# Patient Record
Sex: Male | Born: 1960 | Race: White | Hispanic: No | Marital: Married | Smoking: Current every day smoker
Health system: Southern US, Community
[De-identification: ages and names within clinical notes are randomized; demographics above are authoritative.]

## PROBLEM LIST (undated history)

## (undated) DIAGNOSIS — I739 Peripheral vascular disease, unspecified: Secondary | ICD-10-CM

## (undated) DIAGNOSIS — E785 Hyperlipidemia, unspecified: Secondary | ICD-10-CM

## (undated) DIAGNOSIS — N400 Enlarged prostate without lower urinary tract symptoms: Secondary | ICD-10-CM

## (undated) DIAGNOSIS — I639 Cerebral infarction, unspecified: Secondary | ICD-10-CM

## (undated) DIAGNOSIS — I10 Essential (primary) hypertension: Secondary | ICD-10-CM

## (undated) DIAGNOSIS — N529 Male erectile dysfunction, unspecified: Secondary | ICD-10-CM

## (undated) DIAGNOSIS — H269 Unspecified cataract: Secondary | ICD-10-CM

## (undated) DIAGNOSIS — F329 Major depressive disorder, single episode, unspecified: Secondary | ICD-10-CM

## (undated) DIAGNOSIS — F32A Depression, unspecified: Secondary | ICD-10-CM

## (undated) DIAGNOSIS — G2581 Restless legs syndrome: Secondary | ICD-10-CM

## (undated) HISTORY — DX: Unspecified cataract: H26.9

## (undated) HISTORY — DX: Male erectile dysfunction, unspecified: N52.9

## (undated) HISTORY — DX: Cerebral infarction, unspecified: I63.9

## (undated) HISTORY — DX: Benign prostatic hyperplasia without lower urinary tract symptoms: N40.0

## (undated) HISTORY — DX: Peripheral vascular disease, unspecified: I73.9

## (undated) HISTORY — DX: Restless legs syndrome: G25.81

## (undated) HISTORY — DX: Hyperlipidemia, unspecified: E78.5

## (undated) HISTORY — DX: Major depressive disorder, single episode, unspecified: F32.9

## (undated) HISTORY — PX: CATARACT EXTRACTION: SUR2

## (undated) HISTORY — DX: Depression, unspecified: F32.A

---

## 2003-01-01 ENCOUNTER — Encounter: Payer: Self-pay | Admitting: *Deleted

## 2003-01-03 ENCOUNTER — Ambulatory Visit (HOSPITAL_COMMUNITY): Admission: RE | Admit: 2003-01-03 | Discharge: 2003-01-04 | Payer: Self-pay | Admitting: *Deleted

## 2003-11-15 ENCOUNTER — Ambulatory Visit (HOSPITAL_COMMUNITY): Admission: RE | Admit: 2003-11-15 | Discharge: 2003-11-15 | Payer: Self-pay | Admitting: *Deleted

## 2003-11-15 HISTORY — PX: ANGIOPLASTY / STENTING ILIAC: SUR31

## 2004-08-25 ENCOUNTER — Ambulatory Visit: Payer: Self-pay | Admitting: Cardiology

## 2004-09-09 ENCOUNTER — Ambulatory Visit: Payer: Self-pay | Admitting: Cardiology

## 2004-09-11 ENCOUNTER — Ambulatory Visit: Payer: Self-pay | Admitting: Cardiology

## 2004-09-14 ENCOUNTER — Inpatient Hospital Stay (HOSPITAL_BASED_OUTPATIENT_CLINIC_OR_DEPARTMENT_OTHER): Admission: RE | Admit: 2004-09-14 | Discharge: 2004-09-14 | Payer: Self-pay | Admitting: Cardiology

## 2004-09-30 ENCOUNTER — Ambulatory Visit: Payer: Self-pay | Admitting: Cardiology

## 2007-03-28 ENCOUNTER — Encounter: Admission: RE | Admit: 2007-03-28 | Discharge: 2007-03-28 | Payer: Self-pay | Admitting: Internal Medicine

## 2007-03-30 ENCOUNTER — Ambulatory Visit: Payer: Self-pay | Admitting: *Deleted

## 2008-03-14 ENCOUNTER — Ambulatory Visit: Payer: Self-pay | Admitting: *Deleted

## 2008-04-11 ENCOUNTER — Ambulatory Visit: Payer: Self-pay | Admitting: *Deleted

## 2008-10-10 ENCOUNTER — Ambulatory Visit: Payer: Self-pay | Admitting: *Deleted

## 2009-06-13 ENCOUNTER — Ambulatory Visit: Payer: Self-pay | Admitting: Vascular Surgery

## 2010-08-18 ENCOUNTER — Ambulatory Visit: Payer: Self-pay | Admitting: Vascular Surgery

## 2011-01-19 NOTE — Procedures (Signed)
LOWER EXTREMITY ARTERIAL DUPLEX   INDICATION:  Follow up stent placement.   HISTORY:  Diabetes:  No.  Cardiac:  No.  Hypertension:  No.  Smoking:  Yes.  Previous Surgery:  Bilateral common iliac artery kissing stent  11/15/2003.   SINGLE LEVEL ARTERIAL EXAM                          RIGHT                LEFT  Brachial:               160                  160  Anterior tibial:        147                  137  Posterior tibial:       169                  146  Peroneal:  Ankle/Brachial Index:   1.06                 0.91   LOWER EXTREMITY ARTERIAL DUPLEX EXAM   DUPLEX:  1. Patent bilateral common iliac arteries with no evidence of stenosis      visualized.  2. Velocities shown on the following worksheet.   IMPRESSION:  1. Normal right ankle brachial indices.  2. Mildly decreased left ankle brachial indices.  3. Patent bilateral common iliac arteries.   ___________________________________________  Larina Earthly, M.D.   EM/MEDQ  D:  08/19/2010  T:  08/19/2010  Job:  [id number]

## 2011-01-19 NOTE — Assessment & Plan Note (Signed)
OFFICE VISIT   Jason Mccall, Jason Mccall  DOB:  1960-12-18                                       04/11/2008  ZOXWR#:[id number]   The patient most recently underwent bilateral common iliac kissing stent  balloon angioplasty in March of 2005.  This was carried out for right  lower extremity claudication.  He does continue to smoke one half to one  pack of cigarettes daily.  He notes some discomfort in his left hip and  left thigh with ambulation.   Recent Doppler evaluation reveals ankle brachial indices 1.0 on the  right and 0.82 on the left.  These compare to values of 1.0 on the right  and 1.1 on the left 1 year ago.  He does show some drop in left ABI over  the past year with recent onset of symptoms.   CURRENT MEDICATIONS:  1. Include aspirin 81 mg daily.  2. Simvastatin.   ALLERGIES:  No known allergies.   PHYSICAL EXAMINATION:  General:  On evaluation the patient appears  generally well.  He is now 50 years old.  Alert and oriented and in no  distress.  Vital signs:  BP 129/86, pulse 51 per minute.  Abdomen:  Abdominal exam is unremarkable.  No masses or organomegaly.  No  abdominal bruits.  Nontender.  Lower extremities:  1+ left femoral, 2+  right femoral pulse, 1+ left popliteal and dorsalis pedis pulse, 2+  right popliteal and dorsalis pedis pulse.   Findings and symptoms are most consistent with a left iliac stenosis.  I  have offered a redo arteriogram and possible reangioplasty with  stenting.  I again counseled him regarding his tobacco use.  He would  like to wait on this for the time being and he will follow up in 6  months when we will obtain some images of his left iliac system, with  repeat ABIs.   Balinda Quails, M.D.  Electronically Signed   PGH/MEDQ  D:  04/11/2008  T:  04/12/2008  Job:  [id number]   cc:   Donzetta Sprung

## 2011-01-22 NOTE — Op Note (Signed)
NAME:  Jason Mccall, Jason Mccall NO.:  [id number]   MEDICAL RECORD NO.:  [id number]                   PATIENT TYPE:  OIB   LOCATION:  2899                                 FACILITY:  [facility]   PHYSICIAN:  Balinda Quails, M.D.                 DATE OF BIRTH:  March 15, 1961   DATE OF PROCEDURE:  11/15/2003  DATE OF DISCHARGE:  11/15/2003                                 OPERATIVE REPORT   PREOPERATIVE DIAGNOSIS:  Recurrent right lower extremity claudication.   POSTOPERATIVE DIAGNOSIS:  Recurrent right lower extremity claudication.   PROCEDURES:  1. Abdominal aortogram with bilateral lower extremity runoff arteriography.  2. Bilateral common iliac artery kissing stent balloon angioplasty.  3. Bilateral common femoral artery Angio-Seal closure.   SURGEON:  Balinda Quails, M.D.   ACCESS:  Bilateral common femoral artery sheaths (right 7 [place], left 6  [place]).   CONTRAST:  85 ml Visipaque.   COMPLICATIONS:  None apparent.   INDICATIONS:  Mr. Cuttino is a 50 year old male with a history of right common  iliac artery stenting and angioplasty for claudication.  He presented with  recurrent symptoms.  His symptoms are most consistent with recurrent  stenosis, and he is brought the catheterization lab at this time for  diagnostic arteriography and possible further angioplasty.   DESCRIPTION OF PROCEDURE:  The patient was brought to the catheterization  lab in stable condition.  Placed in the supine position, administered 2 mg  of Versed and 50 mcg of fentanyl intravenously.   The skin and subcutaneous tissue in the right groin were instilled with 1%  Xylocaine.  A needle was easily introduced into the right common femoral  arteries.  A 0.325 J-wire was passed through the needle and into the into  the abdominal aorta under fluoroscopy.  The needle was removed and the long  6 French sheath advanced over the guidewire into the right common iliac  artery.  The dilator was  removed and sheath flushed with heparin and saline  infusion.   A retrograde injection was then made through the sheath to delineate the  aortic bifurcation.  This revealed a previously placed Palmaz stent at the  origin of the right common iliac artery.  There was evidence of in-stent  restenosis.   Due to the proximity of the lesion to the aortic bifurcation, access was  then engaged to the left groin also.  Skin and subcutaneous tissue were  instilled with 1% Xylocaine.  A needle was easily introduced into the left  common femoral artery.  A 0.35 J-wire passed through the needle into the  abdominal aorta.  A short 6 French sheath was advanced over the guidewire.  The dilator removed and the sheath flushed with heparin and saline solution.   Bilateral PowerFlex 8 x 2 balloons were then advanced over the guidewires  and placed in a kissing balloon technique at the aortic  bifurcation.  These  were inflated at 10 atmospheres in the right groin and 8 atmospheres on the  left x 30 seconds.  At completion of this, a retrograde injection was made  through the right femoral sheath.  This revealed residual resistant stenosis  in the stent in the right common iliac artery.  An exchange was carried out  for 9 x 2 PowerFlex balloon on the right.  Repeat kissing balloon technique  was carried out with the 8 mm balloon in the left common iliac artery.  The  balloons were inflated at 10 atmospheres on the right and 8 atmospheres on  the left for 30 seconds.  At completion of this, the balloons were removed.  A pigtail catheter was then advanced over the guidewire in the right groin  and positioned in the juxta renal aorta.   An AP abdominal aortogram was obtained.  This revealed single widely patent  bilateral renal arteries.  Normal infrarenal aorta.  There was shelf-like  plaque at the terminal right side of the aorta.  This revealed mild residual  stenosis to the right common iliac artery.    The pigtail catheter was brought down to the aortic bifurcation and a lower  extremity runoff arteriogram obtained.  This revealed intact external iliac,  common femoral, profundus femoris, superficial femoral arteries bilaterally.  Normal flow into the popliteal artery bilaterally.  Aberrant takeoff to the  right anterior tibial artery was present.  The tibial vessels were patent  bilaterally to the foot.   This completed the arteriogram procedure.  The guidewire was reinserted and  the pigtail catheter removed.  The left femoral guidewire was removed.   Both groins were then closed with Angio-Seal.  The guidewire was inserted  through the sheath bilaterally.  The sheaths were removed.  Angio-Seal  device inserted over the guidewire. Pulsatile flow obtained.  The collagen  plug advanced and positioned appropriately in the common femoral artery.   The patient tolerated the procedure well.  There were no apparent  complications.   FINAL IMPRESSION:  1. 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  2. Successful kissing balloon bilateral common iliac angioplasty.  3. Intact infrarenal runoff bilaterally.   DISPOSITION:  The results have been reviewed with the patient.  Followup  will be arranged for the office.  The patient was again instructed regarding  discontinuation of tobacco.                                               Balinda Quails, M.D.    PGH/MEDQ  D:  11/15/2003  T:  11/18/2003  Job:  [id number]   cc:   Donzetta Sprung  [address], [address]  [place]  [place] [address]  Fax: [phone]   [facility] Peripheral Vascular Cath Lab

## 2011-01-22 NOTE — H&P (Signed)
NAME:  Jason Mccall, Jason Mccall NO.:  [id number]   MEDICAL RECORD NO.:  [id number]                   PATIENT TYPE:  OIB   LOCATION:  5725                                 FACILITY:  [facility]   PHYSICIAN:  Balinda Quails, M.D.                 DATE OF BIRTH:  14-Apr-1961   DATE OF ADMISSION:  01/03/2003  DATE OF DISCHARGE:                                HISTORY & PHYSICAL   PRIMARY CAREGIVER:  Donzetta Sprung, M.D., [place], [place].   PRESENTING CIRCUMSTANCE:  My right leg would keep cramping up on me and it  is getting more pronounced.   HISTORY OF PRESENT ILLNESS:  The patient is a 50 year old male who has been  having cramping and fatigue in his right calf, which has now been  progressing upward to his thigh and buttock.  When mowing the lawn, he  notices that his right leg gets weak and he starts limping.  Ankle brachial  indexes were taken on December 27, 2002, and on the right 0.71 and on the left  1.2.  He has just completed an aortogram with stenting of his right common  iliac artery.  He is transferred to the 5700 floor for overnight  observation.   ALLERGIES:  No known drug allergies.   MEDICATIONS:  Aspirin 325 mg daily.   PAST MEDICAL HISTORY:  History of long-term current tobacco habituation.  Family history of premature atherosclerotic coronary artery disease.  The  patient himself denies any prior history of coronary artery disease,  diabetes, hypertension, seizures, and peptic ulcer disease.  He gives no  diagnosis of cancer.  No history of hemoptysis, palpitations, pulmonary  embolus, deep venous thrombosis, syncope, GI bleed, CVA, or TIA.   PAST SURGICAL HISTORY:  Noncontributory.   SOCIAL HISTORY:  The patient is married.  He has two children.  He works as  a Best boy at [employer].  He has a history of 23 years of smoking one pack per day.  He quit one week ago.  He does not partake of alcoholic beverages.   FAMILY HISTORY:  His mother died at age  35 with congestive heart failure.  His father died at age 35 with myocardial infarction.  One of his brothers  died an alcoholic.  Two brothers are healthy and alive.  Three sisters are  alive and healthy.   PHYSICAL EXAMINATION:  GENERAL APPEARANCE:  This an alert and oriented male  in no acute distress, status post aortogram.  VITAL SIGNS:  The pulse is 55, blood pressure 135/75, and respirations 18.  HEENT:  The patient's eyes show on the right a chronically dilated pupil.  On the left, the pupil is equal, round, and reactive.  Extraocular movements  intact on the left.  The right seems to wander exophthalmically.  LUNGS:  Clear to auscultation and percussion bilaterally.  NECK:  Supple.  No jugular venous distention.  No carotid bruits  auscultated.  HEART:  Regular rate and rhythm.  ABDOMEN:  Benign.  No masses on palpation.  Bowel sounds present.  The left  groin and right groin are without hematoma.  Both have been used for  aortogram this morning.   IMPRESSION:  Right common iliac artery stenosis with claudication symptoms  in the right lower extremity, status post stenting of the right common iliac  artery.   PLAN:  1. Overnight observation.  2. Plavix for six weeks.  3. Aspirin indefinitely.     Maple Mirza, P.A.                    Balinda Quails, M.D.    GM/MEDQ  D:  01/03/2003  T:  01/03/2003  Job:  [id number]

## 2011-01-22 NOTE — Discharge Summary (Signed)
NAME:  Jason Mccall, Jason Mccall NO.:  [id number]   MEDICAL RECORD NO.:  [id number]                   PATIENT TYPE:  OIB   LOCATION:  5725                                 FACILITY:  [facility]   PHYSICIAN:  Balinda Quails, M.D.                 DATE OF BIRTH:  October 10, 1960   DATE OF ADMISSION:  01/03/2003  DATE OF DISCHARGE:  01/04/2003                                 DISCHARGE SUMMARY   DISCHARGE DIAGNOSES:  1. 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  2. Claudication symptoms of right lower extremity.  3. History of tobacco habituation.  4. Family history of premature coronary artery disease.   PROCEDURES:  1. On January 03, 2003, aortogram with bilateral run off with stenting of the     right common iliac artery, Dr. Denman George surgeon.  2. Ankle brachial indexes on January 03, 2003.  On the right, greater than     1.0, on left 1.2.  At [facility] on December 27, 2002, the right was 0.71, on     the left 1.2.  The study indicates improvement in flow in right lower     extremity.   DISPOSITION:  The patient is ready for discharge on postop day #1, after  undergoing stenting of the right common iliac artery.  Both groins were  utilized for catheterization and arteriogram.  There are no hematomas in  either groin.  The patient's pain has been controlled well post procedure.  His mental status is clear.  He is not having any respiratory compromise or  cardiac dysrhythmias.  He is ready for discharge on the morning of April 29.   DISCHARGE MEDICATIONS:  1. Enteric coated aspirin 325 mg daily.  2. Plavix 75 mg daily for a six week course.  3. Darvocet-N 100 one to two tablets p.o. q.4-6h. p.r.n. pain.   ACTIVITY:  Walk daily to keep up his strength.   DIET:  Low sodium, low cholesterol diet.   SPECIAL INSTRUCTIONS:  He is urged to stop smoking.  Nothing he can do will  have a better outcome or more effect for him than stopping  smoking.  He may  shower daily.   FOLLOW UP:  He has an office visit to follow up with Dr. Madilyn Fireman on Monday,  Jan 21, 2003, at 2 p.m.  Ankle brachial indexes will be taken at that time.   HISTORY OF PRESENT ILLNESS:  The patient is a 50 year old male who has been  having cramping and fatigue in his right calf.  It is now progressing upward  to his thigh and buttock.  When mowing the lawn, he notices his right leg  gets weak and he starts limping.  Ankle brachial indexes were taken on December 27, 2002, at [facility] with right 0.71 and left 1.2.  He  will undergo aortogram  with run off and possible stenting procedure on April  29, by Dr. Denman George.    HOSPITAL COURSE:  Hospital course is as described in discharge disposition.  The patient had good outcome from stenting with increase in ankle brachial  index on the right lower extremity.  His hospital course after this  procedure was unremarkable and ready for discharge on April 30.     Maple Mirza, P.ABalinda Quails, M.D.    GM/MEDQ  D:  01/03/2003  T:  01/04/2003  Job:  [id number]   cc:   Donzetta Sprung  [address]  [place]  [place] [address]  Fax: [phone]

## 2011-01-22 NOTE — Cardiovascular Report (Signed)
NAME:  Jason Mccall, Jason Mccall NO.:  [id number]   MEDICAL RECORD NO.:  [id number]          PATIENT TYPE:  OIB   LOCATION:  6501                         FACILITY:  [facility]   PHYSICIAN:  Rollene Rotunda, M.D.   DATE OF BIRTH:  1960/09/19   DATE OF PROCEDURE:  09/14/2004  DATE OF DISCHARGE:                              CARDIAC CATHETERIZATION   PRIMARY CARE PHYSICIAN:  Donzetta Sprung in [place].   CARDIOLOGIST:  Jonelle Sidle, M.D. [facility].   PROCEDURE:  Left heart catheterization/coronary arteriography.   INDICATIONS FOR PROCEDURE:  Evaluate patient with an abnormal Cardiolite  suggesting possible lateral ischemia.   PROCEDURE NOTE:  Left heart catheterization was performed via the right  femoral artery.  The artery was cannulated using the anterior wall puncture.  A #4-French arterial sheath was inserted via the modified Seldinger  technique.  Preformed Judkins and a pig-tail catheter were utilized.  The  patient tolerated the procedure well and left the lab in stable condition.   RESULTS:  1.  Hemodynamics: LV 115/9, AO 109/77.  Coronaries: The left main was      normal.  The LAD was normal.  There was a large first diagonal which was      branching and normal.  The circumflex in the AV groove was normal.      There was a large mid obtuse marginal which was normal.  The right      coronary artery was a dominant vessel.  It was normal throughout its      course.  There was a small PDA and small posterolateral, both of which      were normal.  2.  Left ventriculogram.  A left ventriculogram was obtained in the RAO      projection.  The EF was 65% with normal wall motion.   CONCLUSION:  1.  Normal coronaries.  2.  Normal left ventricular function.   PLAN:  No further cardiac workup is suggested.  The patient will continue to  have secondary risk reduction given his peripheral vascular disease.       JH/MEDQ  D:  09/14/2004  T:  09/14/2004  Job:  [id number]   cc:   Donzetta Sprung  [address]  [place]  [place] [address]  Fax: [phone]   Jonelle Sidle, M.D. [facility]

## 2011-01-22 NOTE — Cardiovascular Report (Signed)
NAME:  ERIEL, DOYON NO.:  [id number]   MEDICAL RECORD NO.:  [id number]                   PATIENT TYPE:  OIB   LOCATION:  NA                                   FACILITY:  [facility]   PHYSICIAN:  Balinda Quails, M.D.                 DATE OF BIRTH:  [birth year]   DATE OF PROCEDURE:  01/03/2003  DATE OF DISCHARGE:                              CARDIAC CATHETERIZATION   DIAGNOSIS:  Right lower extremity claudication.   PROCEDURE:  1. Abdominal aortogram with bilateral lower extremity runoff arteriography.  2. Right common iliac percutaneous transluminal angioplasty.  3. Right common iliac stent placement.   ACCESS:  Bilateral common femoral arteries.   SHEATHS:  6-French right, 5-French left.   CONTRAST:  A total of 5 mL of Visipaque.   COMPLICATIONS:  None apparent.   CLINICAL NOTE:  The patient is a 50 year old male with a history of heavy  tobacco use.  He was seen and evaluated in the office with right lower  extremity claudication.  Evaluation was consistent with right iliac  occlusive disease and he is brought to the cath lab at this time for  diagnostic arteriography and possible intervention.   PROCEDURE NOTE:  Patient brought to the cath lab in stable condition.  Placed in the supine position.  Administered 2 mg of Nubain and 2 mg of  Versed intravenously.  Skin and subcutaneous tissue in the right groin were  instilled with 1% Xylocaine.  The needle was easily introduced into the  right common femoral artery.  A 0.035 J wire passed through the needle.  This, however, stopped at the aortic bifurcation.  A 5-French sheath was  advanced over the guidewire, the dilator removed and the sheath flushed with  heparin and saline solution, the guidewire removed and a 0.035 Magic Torque  guidewire advanced through the sheath and across the aortic bifurcation into  the abdominal aorta.  A standard pigtail catheter was advanced over the  guidewire and an  AP abdominal aortogram obtained.  This revealed single  widely patent bilateral renal arteries.  The infrarenal aorta was lined with  plaque and there was some terminal tapering, but no significant stenosis.  There was a high-grade stenosis in the right common iliac artery, just  beyond its origin, estimated to be 70-80%.  The left common iliac artery was  widely patent.  The external iliac arteries were normal bilaterally.  The  internal iliac arteries were patent bilaterally.   Oblique projections of the pelvis were obtained in the LAO and RAO  projection.  These verified the high-grade stenosis of the right common  iliac artery, just down to its origin.  No other significant iliac  abnormality was identified.   Lower extremity runoff arteriography was obtained.  This revealed the common  femoral, superficial femoral and profunda femoris arteries to be widely  patent bilaterally.  Normal popliteal  arteries bilaterally.  In the right  leg, there was aberrant takeoff of the posterior tibial artery, high on the  popliteal artery.  There was intact three-vessel runoff bilaterally at the  calf level.   The Magic Torque guidewire was then reinserted through the right femoral  sheath and the catheter removed and exchange carried out for a long 6-French  right femoral sheath.   Skin and subcutaneous tissue in the left groin were instilled with 1%  Xylocaine.  The needle was easily introduced into the left common femoral  artery.  A 0.035 J wire passed through the needle into the mid abdominal  aorta, the needle removed and a 5-French sheath advanced over the left  femoral guidewire, the dilator removed and the sheath flushed with heparin  and saline solution.  The left femoral guidewire was placed for protection  of the bifurcation.   A __________ towel tape was then placed along the course of the right iliac  guidewire,  retrograde injection made through the right femoral sheath to   delineate the right common iliac stenosis.  A predilatation was carried out  with a 6-mm x 2-cm Power-Flex balloon inflated at 10 atmospheres for 30  seconds, this balloon then removed and post-dilatation arteriogram was  obtained.  There was residual stenosis.  An 8 x 24 Genesis stent on an Opta-  Pro balloon was advanced over the guidewire.  The sheath was advanced across  the stenosis, the stent positioned appropriately and the sheath withdrawn.  The stent was deployed at 9 atmospheres x30 seconds.  The Opta-Pro balloon  the removed.  A post-deployment arteriogram was obtained.  There appeared to  be residual stenosis in the right common iliac plaque.  A 9 x 2 Power-Flex  balloon was then placed in the stent and deployed at 6 atmospheres x30  seconds, this balloon then removed and a final completion retrograde  arteriogram obtained.  This revealed excellent position of the stent with no  evidence of significant residual stenosis in the right common iliac artery.   The guidewires were then removed, ACT checked and bilateral sheaths removed  appropriately.   There were no apparent complications.   FINAL IMPRESSION:  1. Right lower extremity claudication associated with severe right common     iliac artery stenosis.  2. Successful angioplasty and stent deployment of right common iliac     stenosis.   DISPOSITION:  The patient will be admitted to the hospital overnight for  observation, will receive six weeks of Plavix therapy.                                                Balinda Quails, M.D.    PGH/MEDQ  D:  01/03/2003  T:  01/04/2003  Job:  [id number]   cc:   [facility]   [place], [place] [address].D.

## 2012-06-14 ENCOUNTER — Encounter: Payer: Self-pay | Admitting: Vascular Surgery

## 2013-02-26 ENCOUNTER — Encounter: Payer: Self-pay | Admitting: *Deleted

## 2013-02-26 ENCOUNTER — Other Ambulatory Visit: Payer: Self-pay | Admitting: *Deleted

## 2013-02-26 DIAGNOSIS — I739 Peripheral vascular disease, unspecified: Secondary | ICD-10-CM

## 2013-04-16 ENCOUNTER — Encounter: Payer: Self-pay | Admitting: Surgery

## 2013-04-16 ENCOUNTER — Other Ambulatory Visit: Payer: Self-pay

## 2013-05-31 ENCOUNTER — Other Ambulatory Visit: Payer: Self-pay

## 2013-05-31 ENCOUNTER — Encounter: Payer: Self-pay | Admitting: Vascular Surgery

## 2013-06-06 ENCOUNTER — Other Ambulatory Visit (HOSPITAL_COMMUNITY): Payer: Self-pay

## 2013-06-06 ENCOUNTER — Encounter (HOSPITAL_COMMUNITY): Payer: Self-pay

## 2013-06-06 ENCOUNTER — Encounter: Payer: Self-pay | Admitting: Vascular Surgery

## 2013-07-10 ENCOUNTER — Encounter: Payer: Self-pay | Admitting: Vascular Surgery

## 2013-07-11 ENCOUNTER — Ambulatory Visit (INDEPENDENT_AMBULATORY_CARE_PROVIDER_SITE_OTHER)
Admission: RE | Admit: 2013-07-11 | Discharge: 2013-07-11 | Disposition: A | Discharge status: Home / Self Care | Payer: No Typology Code available for payment source | Source: Ambulatory Visit | Attending: Surgery | Admitting: Surgery

## 2013-07-11 ENCOUNTER — Encounter: Payer: Self-pay | Admitting: Vascular Surgery

## 2013-07-11 ENCOUNTER — Ambulatory Visit (HOSPITAL_COMMUNITY)
Admission: RE | Admit: 2013-07-11 | Discharge: 2013-07-11 | Disposition: A | Discharge status: Home / Self Care | Payer: No Typology Code available for payment source | Source: Ambulatory Visit | Attending: Vascular Surgery | Admitting: Vascular Surgery

## 2013-07-11 ENCOUNTER — Ambulatory Visit (INDEPENDENT_AMBULATORY_CARE_PROVIDER_SITE_OTHER): Payer: No Typology Code available for payment source | Admitting: Vascular Surgery

## 2013-07-11 VITALS — BP 135/70 | HR 48 | Ht 71.0 in | Wt 173.0 lb

## 2013-07-11 DIAGNOSIS — I739 Peripheral vascular disease, unspecified: Secondary | ICD-10-CM | POA: Insufficient documentation

## 2013-07-11 DIAGNOSIS — Z48812 Encounter for surgical aftercare following surgery on the circulatory system: Secondary | ICD-10-CM

## 2013-07-11 NOTE — Progress Notes (Signed)
[facility] [place]  Patient name: Jason Mccall MRN: [id number] DOB: 11/23/1960 Sex: male  REASON FOR CONSULT: follow up of peripheral vascular disease  HPI: Jason Mccall is a 52 y.o. male who had bilateral common iliac artery stents placed by Dr. Madilyn Fireman in 2005. He has not been seen in our office for over 3 years. He was having some pain in the right calf he comes in for evaluation. He describes some cramping pain in both calves but more significantly on the right side. He denies any history of claudication, rest pain, or nonhealing ulcers.  He smokes one half packs per day and has been smoking for 30 years although he is currently trying to cut back and states that he is down to about a pack per day.   Past Medical History  Diagnosis Date  . Hyperlipidemia   . Depression   . Peripheral vascular disease   . ED (erectile dysfunction)   . Restless leg syndrome   . BPH (benign prostatic hypertrophy)   . Cataract     Right Eye   Family History  Problem Relation Age of Onset  . Heart disease Mother   . Hyperlipidemia Mother   . Hypertension Mother   . Heart disease Father   . Hyperlipidemia Father   . Hypertension Father   . Heart attack Father   . Brain cancer Brother 31  . Cancer Brother   . Hyperlipidemia Brother   . Bleeding Disorder Brother   . Cancer Sister   . Hyperlipidemia Sister   . Hyperlipidemia Sister    SOCIAL HISTORY: History  Substance Use Topics  . Smoking status: Current Every Day Smoker -- 1.50 packs/day for 30 years    Types: Cigarettes  . Smokeless tobacco: Never Used  . Alcohol Use: Yes     Comment: occasional   No Known Allergies Current Outpatient Prescriptions  Medication Sig Dispense Refill  . aspirin 81 MG tablet Take 81 mg by mouth daily.      . citalopram (CELEXA) 40 MG tablet Take 40 mg by mouth daily.      . Omega-3 Fatty Acids (FISH OIL) 1000 MG CAPS Take by mouth daily.      . pramipexole (MIRAPEX) 0.25 MG  tablet Take 0.25 mg by mouth every evening.      . simvastatin (ZOCOR) 40 MG tablet Take 40 mg by mouth every evening.      . tadalafil (CIALIS) 10 MG tablet Take 10 mg by mouth daily as needed for erectile dysfunction.       No current facility-administered medications for this visit.   REVIEW OF SYSTEMS: Arly.Keller ] denotes positive finding; [  ] denotes negative finding  CARDIOVASCULAR:  [ ]  chest pain   [ ]  chest pressure   [ ]  palpitations   Arly.Keller ] orthopnea   Arly.Keller ] dyspnea on exertion   [ ]  claudication   [ ]  rest pain   [ ]  DVT   [ ]  phlebitis PULMONARY:   [ ]  productive cough   [ ]  asthma   Arly.Keller ] wheezing NEUROLOGIC:   [ ]  weakness  [ ]  paresthesias  [ ]  aphasia  [ ]  amaurosis  [ ]  dizziness HEMATOLOGIC:   [ ]  bleeding problems   [ ]  clotting disorders MUSCULOSKELETAL:  [ ]  joint pain   [ ]  joint swelling [ ]  leg swelling GASTROINTESTINAL: [ ]   blood in stool  [ ]   hematemesis GENITOURINARY:  [ ]   dysuria  [ ]   hematuria PSYCHIATRIC:  [ ]  history of major depression INTEGUMENTARY:  [ ]  rashes  [ ]  ulcers CONSTITUTIONAL:  [ ]  fever   [ ]  chills  PHYSICAL EXAM: Filed Vitals:   07/11/13 1023  BP: 135/70  Pulse: 48  Height: 5\' 11"  (1.803 m)  Weight: 173 lb (78.472 kg)  SpO2: 100%   Body mass index is 24.14 kg/(m^2). GENERAL: The patient is a well-nourished male, in no acute distress. The vital signs are documented above. CARDIOVASCULAR: There is a regular rate and rhythm. I do not detect carotid bruits. He has palpable femoral pulses. He has a palpable right popliteal pulse and right posterior tibial pulse. I cannot palpate a right dorsalis pedis pulse. I cannot palpate a left popliteal, dorsalis pedis, or posterior tibial pulse. He has no significant lower extremity swelling. PULMONARY: There is good air exchange bilaterally without wheezing or rales. ABDOMEN: Soft and non-tender with normal pitched bowel sounds.  MUSCULOSKELETAL: There are no major deformities or cyanosis. NEUROLOGIC: No  focal weakness or paresthesias are detected. SKIN: There are no ulcers or rashes noted. PSYCHIATRIC: The patient has a normal affect.  DATA:  I have independently interpreted his arterial Doppler study. He has an ABI of 100% on the right and 92% on the left. This has not changed compared this study in December 2011.  I've interpreted her duplex of his aorta which shows that both stents are patent. He has triphasic Doppler signals in both feet.  MEDICAL ISSUES:  Peripheral vascular disease, unspecified This patient had bilateral iliac stents placed by Dr. Madilyn Fireman in 2005. Stents are patent. He has an ABI of 100% on the right and 90% on the left. He had a long discussion about the importance of tobacco cessation. He does seem interested in quitting and I have given him the number for the [place] tobacco cessation program. We have also discussed the importance of exercise and nutrition. We will continue to follow his stents and I have ordered follow up ABIs in his duplex in 1 year. He knows to call sooner if he has problems.   Jason Mccall S [facility] [place] Beeper: [phone]

## 2013-07-11 NOTE — Assessment & Plan Note (Signed)
This patient had bilateral iliac stents placed by Dr. Madilyn Fireman in 2005. Stents are patent. He has an ABI of 100% on the right and 90% on the left. He had a long discussion about the importance of tobacco cessation. He does seem interested in quitting and I have given him the number for the [place] tobacco cessation program. We have also discussed the importance of exercise and nutrition. We will continue to follow his stents and I have ordered follow up ABIs in his duplex in 1 year. He knows to call sooner if he has problems.

## 2014-07-03 ENCOUNTER — Other Ambulatory Visit: Payer: Self-pay | Admitting: *Deleted

## 2014-07-03 DIAGNOSIS — Z48812 Encounter for surgical aftercare following surgery on the circulatory system: Secondary | ICD-10-CM

## 2014-07-03 DIAGNOSIS — I739 Peripheral vascular disease, unspecified: Secondary | ICD-10-CM

## 2014-07-17 ENCOUNTER — Inpatient Hospital Stay (HOSPITAL_COMMUNITY)
Admission: RE | Admit: 2014-07-17 | Discharge: 2014-07-17 | Disposition: A | Discharge status: Home / Self Care | Payer: No Typology Code available for payment source | Source: Ambulatory Visit | Attending: Vascular Surgery | Admitting: Vascular Surgery

## 2014-07-17 ENCOUNTER — Ambulatory Visit: Payer: No Typology Code available for payment source | Admitting: Family

## 2014-07-17 ENCOUNTER — Encounter (HOSPITAL_COMMUNITY): Payer: No Typology Code available for payment source

## 2014-07-17 DIAGNOSIS — I739 Peripheral vascular disease, unspecified: Secondary | ICD-10-CM

## 2014-07-17 DIAGNOSIS — Z48812 Encounter for surgical aftercare following surgery on the circulatory system: Secondary | ICD-10-CM

## 2014-08-28 ENCOUNTER — Ambulatory Visit: Payer: No Typology Code available for payment source | Admitting: Family

## 2014-08-28 ENCOUNTER — Other Ambulatory Visit (HOSPITAL_COMMUNITY): Payer: No Typology Code available for payment source

## 2014-08-28 ENCOUNTER — Encounter (HOSPITAL_COMMUNITY): Payer: No Typology Code available for payment source

## 2014-10-03 ENCOUNTER — Encounter: Payer: Self-pay | Admitting: Family

## 2014-10-04 ENCOUNTER — Ambulatory Visit (HOSPITAL_COMMUNITY): Payer: No Typology Code available for payment source

## 2014-10-04 ENCOUNTER — Ambulatory Visit: Payer: No Typology Code available for payment source | Admitting: Family

## 2014-10-30 ENCOUNTER — Ambulatory Visit (HOSPITAL_COMMUNITY)
Admission: RE | Admit: 2014-10-30 | Discharge: 2014-10-30 | Disposition: A | Discharge status: Home / Self Care | Payer: BLUE CROSS/BLUE SHIELD | Source: Ambulatory Visit | Attending: Vascular Surgery | Admitting: Vascular Surgery

## 2014-10-30 ENCOUNTER — Telehealth: Payer: Self-pay | Admitting: *Deleted

## 2014-10-30 ENCOUNTER — Ambulatory Visit (INDEPENDENT_AMBULATORY_CARE_PROVIDER_SITE_OTHER)
Admission: RE | Admit: 2014-10-30 | Discharge: 2014-10-30 | Disposition: A | Discharge status: Home / Self Care | Payer: BLUE CROSS/BLUE SHIELD | Source: Ambulatory Visit | Attending: Vascular Surgery | Admitting: Vascular Surgery

## 2014-10-30 DIAGNOSIS — Z48812 Encounter for surgical aftercare following surgery on the circulatory system: Secondary | ICD-10-CM | POA: Diagnosis not present

## 2014-10-30 DIAGNOSIS — I739 Peripheral vascular disease, unspecified: Secondary | ICD-10-CM

## 2014-10-30 NOTE — Telephone Encounter (Signed)
Mr. Jason Mccall was in our office today for his vascular scans; the tech notified me that his systolic was 212 while performing the scan. I went over to talk to the patient and do VS.    Right arm sitting 185 / 74  Left arm Sitting  167 / 71; both done by [facility]. Patient reports no symptoms of CVA including no headache, dizziness, numbness, visual or speech problems. He does report having eaten more salt in his diet recently. I called his PCP, Dr. Donzetta Sprungerry Daniel [phone] , and [facility] this increased BP [facility] She will relay to Dr. Reuel Boomaniel and they will call the patient to make him a follow up appt asap. Patient voiced understanding that he needs to make sure he limits his sodium intake and he knows to follow up with Dr. Reuel Boomaniel.

## 2014-11-05 ENCOUNTER — Encounter: Payer: Self-pay | Admitting: Family

## 2014-11-06 ENCOUNTER — Ambulatory Visit: Payer: No Typology Code available for payment source | Admitting: Family

## 2014-11-28 ENCOUNTER — Ambulatory Visit: Payer: BLUE CROSS/BLUE SHIELD | Admitting: Family

## 2015-11-24 ENCOUNTER — Other Ambulatory Visit: Payer: Self-pay | Admitting: *Deleted

## 2015-11-24 DIAGNOSIS — I7092 Chronic total occlusion of artery of the extremities: Secondary | ICD-10-CM

## 2016-01-09 ENCOUNTER — Encounter: Payer: Self-pay | Admitting: Vascular Surgery

## 2016-01-14 ENCOUNTER — Encounter: Payer: Self-pay | Admitting: Vascular Surgery

## 2016-01-14 ENCOUNTER — Ambulatory Visit (INDEPENDENT_AMBULATORY_CARE_PROVIDER_SITE_OTHER)
Admission: RE | Admit: 2016-01-14 | Discharge: 2016-01-14 | Disposition: A | Discharge status: Home / Self Care | Payer: BLUE CROSS/BLUE SHIELD | Source: Ambulatory Visit | Attending: Vascular Surgery | Admitting: Vascular Surgery

## 2016-01-14 ENCOUNTER — Ambulatory Visit (INDEPENDENT_AMBULATORY_CARE_PROVIDER_SITE_OTHER): Payer: BLUE CROSS/BLUE SHIELD | Admitting: Vascular Surgery

## 2016-01-14 ENCOUNTER — Ambulatory Visit (HOSPITAL_COMMUNITY)
Admission: RE | Admit: 2016-01-14 | Discharge: 2016-01-14 | Disposition: A | Discharge status: Home / Self Care | Payer: BLUE CROSS/BLUE SHIELD | Source: Ambulatory Visit | Attending: Vascular Surgery | Admitting: Vascular Surgery

## 2016-01-14 ENCOUNTER — Other Ambulatory Visit: Payer: Self-pay

## 2016-01-14 VITALS — BP 113/71 | HR 55 | Temp 97.1°F | Resp 18 | Wt 176.6 lb

## 2016-01-14 DIAGNOSIS — Z95820 Peripheral vascular angioplasty status with implants and grafts: Secondary | ICD-10-CM

## 2016-01-14 DIAGNOSIS — I70209 Unspecified atherosclerosis of native arteries of extremities, unspecified extremity: Secondary | ICD-10-CM | POA: Diagnosis not present

## 2016-01-14 DIAGNOSIS — E785 Hyperlipidemia, unspecified: Secondary | ICD-10-CM | POA: Insufficient documentation

## 2016-01-14 DIAGNOSIS — R938 Abnormal findings on diagnostic imaging of other specified body structures: Secondary | ICD-10-CM | POA: Diagnosis not present

## 2016-01-14 DIAGNOSIS — I7092 Chronic total occlusion of artery of the extremities: Secondary | ICD-10-CM | POA: Insufficient documentation

## 2016-01-14 DIAGNOSIS — R0989 Other specified symptoms and signs involving the circulatory and respiratory systems: Secondary | ICD-10-CM | POA: Diagnosis present

## 2016-01-14 NOTE — Progress Notes (Signed)
[facility] [place]  Patient name: Jason Mccall MRN: [id number] DOB: 1961-04-09 Sex: male  REASON FOR VISIT: Peripheral vascular disease. Referred by Dr. Almond Lint.  HPI: Jason Mccall is a 55 y.o. male who has had previous bilateral common iliac artery stents in 2005 by Dr. Liliane Bade. I last saw him in November 2014. At that time, he had an ABI of 100% on the right and 92% on the left. Both stents were patent. At the time of his last visit we discussed the importance of tobacco cessation. I recommend a follow up study in 1 year and he was then lost to follow up.  He has been having bilateral leg heaviness and aching when going up and down stairs and doing yardwork. Expenses claudication in both calves which is brought on by exertion and relieved with rest. Symptoms are equal in both lower extremities. He has mostly calf claudication with minimal fine and hip claudication.  He does continue to smoke 1 pack per day of cigarettes and has been smoking for 30 years.  Past Medical History  Diagnosis Date  . Hyperlipidemia   . Depression   . Peripheral vascular disease (HCC)   . ED (erectile dysfunction)   . Restless leg syndrome   . BPH (benign prostatic hypertrophy)   . Cataract     Right Eye    Family History  Problem Relation Age of Onset  . Heart disease Mother   . Hyperlipidemia Mother   . Hypertension Mother   . Heart disease Father   . Hyperlipidemia Father   . Hypertension Father   . Heart attack Father   . Brain cancer Brother 66  . Cancer Brother   . Hyperlipidemia Brother   . Bleeding Disorder Brother   . Cancer Sister   . Hyperlipidemia Sister   . Hyperlipidemia Sister     SOCIAL HISTORY: Social History  Substance Use Topics  . Smoking status: Current Every Day Smoker -- 1.50 packs/day for 30 years    Types: Cigarettes  . Smokeless tobacco: Never Used  . Alcohol Use: Yes     Comment: occasional    No Known  Allergies  Current Outpatient Prescriptions  Medication Sig Dispense Refill  . aspirin 81 MG tablet Take 81 mg by mouth daily.    Marland Kitchen buPROPion (WELLBUTRIN XL) 300 MG 24 hr tablet Take 300 mg by mouth daily.  1  . citalopram (CELEXA) 40 MG tablet Take 40 mg by mouth daily.    Marland Kitchen losartan-hydrochlorothiazide (HYZAAR) 100-12.5 MG tablet Take by mouth daily.  3  . simvastatin (ZOCOR) 40 MG tablet Take 40 mg by mouth every evening.    . tadalafil (CIALIS) 10 MG tablet Take 10 mg by mouth daily as needed for erectile dysfunction.    . Omega-3 Fatty Acids (FISH OIL) 1000 MG CAPS Take by mouth daily. Reported on 01/14/2016    . pramipexole (MIRAPEX) 0.25 MG tablet Take 0.25 mg by mouth every evening. Reported on 01/14/2016     No current facility-administered medications for this visit.    REVIEW OF SYSTEMS:  [X]  denotes positive finding, [ ]  denotes negative finding Cardiac  Comments:  Chest pain or chest pressure:    Shortness of breath upon exertion: X   Short of breath when lying flat:    Irregular heart rhythm:        Vascular    Pain in calf, thigh, or hip brought on by ambulation: X Bilateral calf  Pain in feet at night that wakes you up from your sleep:     Blood clot in your veins:    Leg swelling:  X       Pulmonary    Oxygen at home:    Productive cough:     Wheezing:         Neurologic    Sudden weakness in arms or legs:     Sudden numbness in arms or legs:     Sudden onset of difficulty speaking or slurred speech:    Temporary loss of vision in one eye:     Problems with dizziness:         Gastrointestinal    Blood in stool:     Vomited blood:         Genitourinary    Burning when urinating:     Blood in urine:        Psychiatric    Major depression:         Hematologic    Bleeding problems:    Problems with blood clotting too easily:        Skin    Rashes or ulcers:        Constitutional    Fever or chills:      PHYSICAL EXAM: Filed Vitals:    01/14/16 1150  BP: 113/71  Pulse: 55  Temp: 97.1 F (36.2 C)  TempSrc: Oral  Resp: 18  Weight: 176 lb 9.6 oz (80.105 kg)  SpO2: 98%    GENERAL: The patient is a well-nourished male, in no acute distress. The vital signs are documented above. CARDIAC: There is a regular rate and rhythm.  VASCULAR: I do not detect carotid bruits. He does have palpable femoral pulses bilaterally. I cannot palpate pedal pulses. PULMONARY: There is good air exchange bilaterally without wheezing or rales. ABDOMEN: Soft and non-tender with normal pitched bowel sounds.  MUSCULOSKELETAL: There are no major deformities or cyanosis. NEUROLOGIC: No focal weakness or paresthesias are detected. SKIN: There are no ulcers or rashes noted. PSYCHIATRIC: The patient has a normal affect.  DATA:   ILIAC ARTERY STENT DUPLEX: I have independently interpreted his duplex of his iliac stents. There were some elevated velocities in both stents. On the right side peak systolic velocity was 293 cm/s. On the left side peak systolic velocity was 346 cm/s. This suggests recurrent stenosis in both stents, I cleaned greater than 50%.  ARTERIAL DOPPLER STUDY: have independently interpreted his arterial Doppler study . On the right side there is a triphasic posterior tibial signal and a monophasic dorsalis pedis signal. ABI is 100%. Toe pressure is 75 mmHg.  On the left side, there is a monophasic posterior tibial signal and dorsalis pedis signal. ABI on the left is 87%. Toe pressure on the left is 87 mmHg.   MEDICAL ISSUES:  PERIPHERAL VASCULAR DISEASE: This patient had bilateral common iliac artery stents in 2005 by Dr. Liliane BadeGreg Hayes. He has evidence of recurrent stenosis within the stents. We have discussed the importance of tobacco cessation. Given that he has evidence of significant recurrent disease in his stents and also recurrence of his symptoms I have recommended that we proceed with arteriography in order to evaluate him for  possible re-intervention. We have discussed indications for the procedure and the potential complications including but not limited to bleeding, arterial injury or dissection. All his questions were answered and he is agreeable to proceed. His procedure is scheduled for 01/26/2016.   Waverly Ferrariickson, Christopher [facility] [place] Beeper: [phone]

## 2016-01-15 ENCOUNTER — Encounter: Payer: Self-pay | Admitting: Family Medicine

## 2016-01-26 ENCOUNTER — Encounter (HOSPITAL_COMMUNITY): Payer: Self-pay | Admitting: Vascular Surgery

## 2016-01-26 ENCOUNTER — Encounter (HOSPITAL_COMMUNITY)
Admission: RE | Disposition: A | Discharge status: Home / Self Care | Payer: Self-pay | Source: Ambulatory Visit | Attending: Vascular Surgery

## 2016-01-26 ENCOUNTER — Ambulatory Visit (HOSPITAL_COMMUNITY)
Admission: RE | Admit: 2016-01-26 | Discharge: 2016-01-26 | Disposition: A | Discharge status: Home / Self Care | Payer: BLUE CROSS/BLUE SHIELD | Source: Ambulatory Visit | Attending: Vascular Surgery | Admitting: Vascular Surgery

## 2016-01-26 ENCOUNTER — Other Ambulatory Visit: Payer: Self-pay | Admitting: *Deleted

## 2016-01-26 ENCOUNTER — Telehealth: Payer: Self-pay | Admitting: Vascular Surgery

## 2016-01-26 DIAGNOSIS — I70219 Atherosclerosis of native arteries of extremities with intermittent claudication, unspecified extremity: Secondary | ICD-10-CM

## 2016-01-26 DIAGNOSIS — Z7982 Long term (current) use of aspirin: Secondary | ICD-10-CM | POA: Diagnosis not present

## 2016-01-26 DIAGNOSIS — F329 Major depressive disorder, single episode, unspecified: Secondary | ICD-10-CM | POA: Diagnosis not present

## 2016-01-26 DIAGNOSIS — N400 Enlarged prostate without lower urinary tract symptoms: Secondary | ICD-10-CM | POA: Diagnosis not present

## 2016-01-26 DIAGNOSIS — Y831 Surgical operation with implant of artificial internal device as the cause of abnormal reaction of the patient, or of later complication, without mention of misadventure at the time of the procedure: Secondary | ICD-10-CM | POA: Diagnosis not present

## 2016-01-26 DIAGNOSIS — E785 Hyperlipidemia, unspecified: Secondary | ICD-10-CM | POA: Insufficient documentation

## 2016-01-26 DIAGNOSIS — I70212 Atherosclerosis of native arteries of extremities with intermittent claudication, left leg: Secondary | ICD-10-CM | POA: Insufficient documentation

## 2016-01-26 DIAGNOSIS — F17210 Nicotine dependence, cigarettes, uncomplicated: Secondary | ICD-10-CM | POA: Insufficient documentation

## 2016-01-26 DIAGNOSIS — I70213 Atherosclerosis of native arteries of extremities with intermittent claudication, bilateral legs: Secondary | ICD-10-CM | POA: Diagnosis not present

## 2016-01-26 DIAGNOSIS — I739 Peripheral vascular disease, unspecified: Secondary | ICD-10-CM | POA: Diagnosis present

## 2016-01-26 DIAGNOSIS — Z79899 Other long term (current) drug therapy: Secondary | ICD-10-CM | POA: Diagnosis not present

## 2016-01-26 DIAGNOSIS — T82858A Stenosis of vascular prosthetic devices, implants and grafts, initial encounter: Secondary | ICD-10-CM | POA: Diagnosis not present

## 2016-01-26 DIAGNOSIS — Z9862 Peripheral vascular angioplasty status: Secondary | ICD-10-CM

## 2016-01-26 HISTORY — PX: PERIPHERAL VASCULAR CATHETERIZATION: SHX172C

## 2016-01-26 LAB — POCT I-STAT, CHEM 8
BUN: 10 mg/dL (ref 6–20)
CALCIUM ION: 1.16 mmol/L (ref 1.12–1.23)
CREATININE: 0.8 mg/dL (ref 0.61–1.24)
Chloride: 101 mmol/L (ref 101–111)
Glucose, Bld: 106 mg/dL — ABNORMAL HIGH (ref 65–99)
HCT: 50 % (ref 39.0–52.0)
Hemoglobin: 17 g/dL (ref 13.0–17.0)
Potassium: 4 mmol/L (ref 3.5–5.1)
Sodium: 140 mmol/L (ref 135–145)
TCO2: 25 mmol/L (ref 0–100)

## 2016-01-26 LAB — POCT ACTIVATED CLOTTING TIME
ACTIVATED CLOTTING TIME: 229 s
Activated Clotting Time: 204 seconds

## 2016-01-26 SURGERY — ABDOMINAL AORTOGRAM W/LOWER EXTREMITY
Laterality: Right

## 2016-01-26 MED ORDER — CLOPIDOGREL BISULFATE 75 MG PO TABS: 150.0000 mg | ORAL_TABLET | Freq: Once | ORAL | Status: DC

## 2016-01-26 MED ORDER — CLOPIDOGREL BISULFATE 75 MG PO TABS
ORAL_TABLET | ORAL | Status: AC
  Filled 2016-01-26: qty 1

## 2016-01-26 MED ORDER — MIDAZOLAM HCL 2 MG/2ML IJ SOLN
INTRAMUSCULAR | Status: DC | PRN
  Administered 2016-01-26: 1 mg via INTRAVENOUS

## 2016-01-26 MED ORDER — HEPARIN (PORCINE) IN NACL 2-0.9 UNIT/ML-% IJ SOLN
INTRAMUSCULAR | Status: DC | PRN
  Administered 2016-01-26: 1000 mL via INTRA_ARTERIAL

## 2016-01-26 MED ORDER — FENTANYL CITRATE (PF) 100 MCG/2ML IJ SOLN
INTRAMUSCULAR | Status: DC | PRN
  Administered 2016-01-26: 50 ug via INTRAVENOUS

## 2016-01-26 MED ORDER — FENTANYL CITRATE (PF) 100 MCG/2ML IJ SOLN
INTRAMUSCULAR | Status: AC
  Filled 2016-01-26: qty 2

## 2016-01-26 MED ORDER — LIDOCAINE HCL (PF) 1 % IJ SOLN
INTRAMUSCULAR | Status: DC | PRN
  Administered 2016-01-26: 15 mL via SUBCUTANEOUS

## 2016-01-26 MED ORDER — SODIUM CHLORIDE 0.9 % IV SOLN: 1.0000 mL/kg/h | INTRAVENOUS | Status: DC

## 2016-01-26 MED ORDER — CLOPIDOGREL BISULFATE 75 MG PO TABS: 75.0000 mg | ORAL_TABLET | Freq: Every day | ORAL | Status: DC

## 2016-01-26 MED ORDER — SODIUM CHLORIDE 0.9 % IV SOLN
INTRAVENOUS | Status: DC
  Administered 2016-01-26: 07:00:00 via INTRAVENOUS

## 2016-01-26 MED ORDER — HEPARIN SODIUM (PORCINE) 1000 UNIT/ML IJ SOLN
INTRAMUSCULAR | Status: AC
  Filled 2016-01-26: qty 1

## 2016-01-26 MED ORDER — HEPARIN SODIUM (PORCINE) 1000 UNIT/ML IJ SOLN
INTRAMUSCULAR | Status: DC | PRN
  Administered 2016-01-26: 6000 [IU] via INTRAVENOUS
  Administered 2016-01-26: 1000 [IU] via INTRAVENOUS

## 2016-01-26 MED ORDER — OXYCODONE-ACETAMINOPHEN 5-325 MG PO TABS: 1.0000 | ORAL_TABLET | ORAL | Status: DC | PRN

## 2016-01-26 MED ORDER — HEPARIN (PORCINE) IN NACL 2-0.9 UNIT/ML-% IJ SOLN
INTRAMUSCULAR | Status: AC
  Filled 2016-01-26: qty 1000

## 2016-01-26 MED ORDER — IODIXANOL 320 MG/ML IV SOLN
INTRAVENOUS | Status: DC | PRN
  Administered 2016-01-26: 170 mL via INTRA_ARTERIAL

## 2016-01-26 MED ORDER — LIDOCAINE HCL (PF) 1 % IJ SOLN
INTRAMUSCULAR | Status: AC
  Filled 2016-01-26: qty 30

## 2016-01-26 MED ORDER — MIDAZOLAM HCL 2 MG/2ML IJ SOLN
INTRAMUSCULAR | Status: AC
  Filled 2016-01-26: qty 2

## 2016-01-26 MED ORDER — CLOPIDOGREL BISULFATE 75 MG PO TABS
ORAL_TABLET | ORAL | Status: DC | PRN
  Administered 2016-01-26: 150 mg via ORAL

## 2016-01-26 SURGICAL SUPPLY — 23 items
BALLN ARMADA 8X20X80 (BALLOONS) ×5
BALLN MUSTANG 7.0X40 75 (BALLOONS) ×4
BALLN MUSTANG 7.0X40 75CM (BALLOONS) ×1
BALLOON ARMADA 8X20X80 (BALLOONS) ×3 IMPLANT
BALLOON MUSTANG 7.0X40 75 (BALLOONS) ×3 IMPLANT
CATH ANGIO 5F PIGTAIL 65CM (CATHETERS) ×5 IMPLANT
CATH STRAIGHT 5FR 65CM (CATHETERS) ×5 IMPLANT
COVER PRB 48X5XTLSCP FOLD TPE (BAG) ×3 IMPLANT
COVER PROBE 5X48 (BAG) ×3
DEVICE CONTINUOUS FLUSH (MISCELLANEOUS) ×10 IMPLANT
GUIDEWIRE ANGLED .035X150CM (WIRE) ×5 IMPLANT
KIT ENCORE 26 ADVANTAGE (KITS) ×5 IMPLANT
KIT MICROINTRODUCER STIFF 5F (SHEATH) ×10 IMPLANT
KIT PV (KITS) ×5 IMPLANT
SHEATH BRITE TIP 7FR 35CM (SHEATH) ×5 IMPLANT
SHEATH PINNACLE 5F 10CM (SHEATH) ×10 IMPLANT
SHEATH PINNACLE 6F 10CM (SHEATH) ×5 IMPLANT
STENT ABSOLUTE PRO 8X40X135 (Permanent Stent) ×5 IMPLANT
SYR MEDRAD MARK V 150ML (SYRINGE) ×5 IMPLANT
TRANSDUCER W/STOPCOCK (MISCELLANEOUS) ×5 IMPLANT
TRAY PV CATH (CUSTOM PROCEDURE TRAY) ×5 IMPLANT
WIRE HITORQ VERSACORE ST 145CM (WIRE) ×5 IMPLANT
WIRE VERSACORE LOC 115CM (WIRE) ×5 IMPLANT

## 2016-01-26 NOTE — H&P (View-Only) (Signed)
[facility] [place]  Patient name: Jason Mccall MRN: [id number] DOB: 1961-04-09 Sex: male  REASON FOR VISIT: Peripheral vascular disease. Referred by Dr. Almond Lint.  HPI: Jason Mccall is a 55 y.o. male who has had previous bilateral common iliac artery stents in 2005 by Dr. Liliane Bade. I last saw him in November 2014. At that time, he had an ABI of 100% on the right and 92% on the left. Both stents were patent. At the time of his last visit we discussed the importance of tobacco cessation. I recommend a follow up study in 1 year and he was then lost to follow up.  He has been having bilateral leg heaviness and aching when going up and down stairs and doing yardwork. Expenses claudication in both calves which is brought on by exertion and relieved with rest. Symptoms are equal in both lower extremities. He has mostly calf claudication with minimal fine and hip claudication.  He does continue to smoke 1 pack per day of cigarettes and has been smoking for 30 years.  Past Medical History  Diagnosis Date  . Hyperlipidemia   . Depression   . Peripheral vascular disease (HCC)   . ED (erectile dysfunction)   . Restless leg syndrome   . BPH (benign prostatic hypertrophy)   . Cataract     Right Eye    Family History  Problem Relation Age of Onset  . Heart disease Mother   . Hyperlipidemia Mother   . Hypertension Mother   . Heart disease Father   . Hyperlipidemia Father   . Hypertension Father   . Heart attack Father   . Brain cancer Brother 66  . Cancer Brother   . Hyperlipidemia Brother   . Bleeding Disorder Brother   . Cancer Sister   . Hyperlipidemia Sister   . Hyperlipidemia Sister     SOCIAL HISTORY: Social History  Substance Use Topics  . Smoking status: Current Every Day Smoker -- 1.50 packs/day for 30 years    Types: Cigarettes  . Smokeless tobacco: Never Used  . Alcohol Use: Yes     Comment: occasional    No Known  Allergies  Current Outpatient Prescriptions  Medication Sig Dispense Refill  . aspirin 81 MG tablet Take 81 mg by mouth daily.    Marland Kitchen buPROPion (WELLBUTRIN XL) 300 MG 24 hr tablet Take 300 mg by mouth daily.  1  . citalopram (CELEXA) 40 MG tablet Take 40 mg by mouth daily.    Marland Kitchen losartan-hydrochlorothiazide (HYZAAR) 100-12.5 MG tablet Take by mouth daily.  3  . simvastatin (ZOCOR) 40 MG tablet Take 40 mg by mouth every evening.    . tadalafil (CIALIS) 10 MG tablet Take 10 mg by mouth daily as needed for erectile dysfunction.    . Omega-3 Fatty Acids (FISH OIL) 1000 MG CAPS Take by mouth daily. Reported on 01/14/2016    . pramipexole (MIRAPEX) 0.25 MG tablet Take 0.25 mg by mouth every evening. Reported on 01/14/2016     No current facility-administered medications for this visit.    REVIEW OF SYSTEMS:  [X]  denotes positive finding, [ ]  denotes negative finding Cardiac  Comments:  Chest pain or chest pressure:    Shortness of breath upon exertion: X   Short of breath when lying flat:    Irregular heart rhythm:        Vascular    Pain in calf, thigh, or hip brought on by ambulation: X Bilateral calf  Pain in feet at night that wakes you up from your sleep:     Blood clot in your veins:    Leg swelling:  X       Pulmonary    Oxygen at home:    Productive cough:     Wheezing:         Neurologic    Sudden weakness in arms or legs:     Sudden numbness in arms or legs:     Sudden onset of difficulty speaking or slurred speech:    Temporary loss of vision in one eye:     Problems with dizziness:         Gastrointestinal    Blood in stool:     Vomited blood:         Genitourinary    Burning when urinating:     Blood in urine:        Psychiatric    Major depression:         Hematologic    Bleeding problems:    Problems with blood clotting too easily:        Skin    Rashes or ulcers:        Constitutional    Fever or chills:      PHYSICAL EXAM: Filed Vitals:    01/14/16 1150  BP: 113/71  Pulse: 55  Temp: 97.1 F (36.2 C)  TempSrc: Oral  Resp: 18  Weight: 176 lb 9.6 oz (80.105 kg)  SpO2: 98%    GENERAL: The patient is a well-nourished male, in no acute distress. The vital signs are documented above. CARDIAC: There is a regular rate and rhythm.  VASCULAR: I do not detect carotid bruits. He does have palpable femoral pulses bilaterally. I cannot palpate pedal pulses. PULMONARY: There is good air exchange bilaterally without wheezing or rales. ABDOMEN: Soft and non-tender with normal pitched bowel sounds.  MUSCULOSKELETAL: There are no major deformities or cyanosis. NEUROLOGIC: No focal weakness or paresthesias are detected. SKIN: There are no ulcers or rashes noted. PSYCHIATRIC: The patient has a normal affect.  DATA:   ILIAC ARTERY STENT DUPLEX: I have independently interpreted his duplex of his iliac stents. There were some elevated velocities in both stents. On the right side peak systolic velocity was 293 cm/s. On the left side peak systolic velocity was 346 cm/s. This suggests recurrent stenosis in both stents, I cleaned greater than 50%.  ARTERIAL DOPPLER STUDY: have independently interpreted his arterial Doppler study . On the right side there is a triphasic posterior tibial signal and a monophasic dorsalis pedis signal. ABI is 100%. Toe pressure is 75 mmHg.  On the left side, there is a monophasic posterior tibial signal and dorsalis pedis signal. ABI on the left is 87%. Toe pressure on the left is 87 mmHg.   MEDICAL ISSUES:  PERIPHERAL VASCULAR DISEASE: This patient had bilateral common iliac artery stents in 2005 by Dr. Liliane BadeGreg Hayes. He has evidence of recurrent stenosis within the stents. We have discussed the importance of tobacco cessation. Given that he has evidence of significant recurrent disease in his stents and also recurrence of his symptoms I have recommended that we proceed with arteriography in order to evaluate him for  possible re-intervention. We have discussed indications for the procedure and the potential complications including but not limited to bleeding, arterial injury or dissection. All his questions were answered and he is agreeable to proceed. His procedure is scheduled for 01/26/2016.   Waverly Ferrariickson, Lurleen Soltero [facility] [place] Beeper: [phone]

## 2016-01-26 NOTE — Telephone Encounter (Signed)
Sched appt 7/5; lab at 1 and MD at 1:45. Lm on hm# to inform pt.

## 2016-01-26 NOTE — Discharge Instructions (Signed)

## 2016-01-26 NOTE — Interval H&P Note (Signed)
History and Physical Interval Note:  01/26/2016 7:48 AM  Jason Mccall  has presented today for surgery, with the diagnosis of pvd with bilateral lower claudication  The various methods of treatment have been discussed with the patient and family. After consideration of risks, benefits and other options for treatment, the patient has consented to  Procedure(s): Abdominal Aortogram w/Lower Extremity (N/A) as a surgical intervention .  The patient's history has been reviewed, patient examined, no change in status, stable for surgery.  I have reviewed the patient's chart and labs.  Questions were answered to the patient's satisfaction.     Waverly Ferrariickson, Darshana Curnutt

## 2016-01-26 NOTE — Progress Notes (Signed)
29F sheath pulled from R Femoral artery by Rosilyn Mingseborah Young, RN. 20 minutes of manual pressure held. Palpable PT +2 noted post pull. Site is level 0 and dressed with tegaderm and gauze. No discoloration or hematoma noted.

## 2016-01-26 NOTE — Progress Notes (Signed)
Site area: lt groin Site Prior to Removal:  Level  0 Pressure Applied For: 20 minutes Manual:   yes Patient Status During Pull:  stable Post Pull Site:  Level 0 Post Pull Instructions Given:  yes Post Pull Pulses Present: yes Dressing Applied:  tegaderm Bedrest begins @  1100 Comments:

## 2016-01-26 NOTE — Telephone Encounter (Signed)
-----   Message from Sharee PimpleMarilyn K McChesney, RN sent at 01/26/2016 10:35 AM EDT ----- Regarding: schedule   ----- Message -----    From: Chuck Hinthristopher S Dickson, MD    Sent: 01/26/2016   9:42 AM      To: Vvs Charge Pool Subject: charge and f/u                                 PROCEDURE:  1. Ultrasound-guided access to the left common femoral artery 2. Aortogram with bilateral iliac arteriogram and bilaterallower extremity runoff 3. Angioplasty and stenting of the left common iliac artery and extremity artery 4. Ultrasound-guided access to the right common femoral artery 5. Balloon angioplasty of in-stent stenosis right common iliac artery  SURGEON: Di Kindlehristopher S. Edilia Boickson, MD, FACS  He will need a follow up visit in 6 weeks with ABIs. Thank you. CD

## 2016-01-26 NOTE — Op Note (Signed)
NAME: Jason Mccall   MRN: [id number] DOB: 1961-06-26    DATE OF OPERATION: 01/26/2016  PREOP DIAGNOSIS: Bilateral lower extremity disabling claudication  POSTOP DIAGNOSIS: Same  PROCEDURE:  1. Ultrasound-guided access to the left common femoral artery 2. Aortogram with bilateral iliac arteriogram and bilaterallower extremity runoff 3. Angioplasty and stenting of the left common iliac artery and extremity artery 4. Ultrasound-guided access to the right common femoral artery 5. Balloon angioplasty of in-stent stenosis right common iliac artery  SURGEON: Judeth Cornfield. Scot Dock, MD, FACS  ANESTHESIA: Local with sedation   EBL: minimal  INDICATIONS: Jason Mccall is a 55 y.o. male wwho presented with disabling claudication. Dr. Andy Gauss performed previous right common iliac artery stenting and it was some elevated velocities within the stent. There were also significantly elevated velocities in the left in the distal common iliac artery with a drop in ABI on the left. [name].  TECHNIQUE: The patient was brought to the peripheral vascular lab. The period of conscious sedation began at (7:47 AM) and ended at (9:17 AM).  During that time period, I was present face-to-face 100% of the time.  The patient was administered (1 mg of Versed and 50 g of fentanyl). The patient's heart rate, blood pressure, and oxygen saturation were monitored by the nurse continuously during the procedure.  Both groins were prepped and draped in usual sterile fashion. Under ultrasound guidance, after the skin was anesthetized, the left common femoral artery was cannulated with a micropuncture needle and a micropuncture sheath introduced over the wire. This was exchanged for a 5 [place] sheath over a Bentson wire. The Bentson wire would not advance through the stenosis in the common iliac artery and therefore I used an angled  Glidewire to advance up into the infrarenal aorta. Pigtail catheter was positioned at the L1 vertebral body and flush aortogram obtained. The catheter was positioned above the aortic bifurcation and bilateral lower extremity runoff films were obtained.  There was an 80% stenosis in the distal left common iliac artery and proximal left external iliac artery right at the takeoff of the hypogastric artery. I elected to address this with balloon angioplasty. The 5 French sheath was exchanged for a 7 [place] sheath and the patient was heparinized. ACT was 239. I selected an 8 mm x 4 cm self-expanding stent. This was positioned across the stenosis and deployed without difficulty. Postdilatation was done with a 7 mm x 4 cm balloon. Completion film showed no residual stenosis with an excellent result.  Given that the patient was having symptoms on the right side and there appeared to be some narrowing in the proximal stent on the right and also elevated velocities on her preoperative duplex, I elected to stick the right side. Under ultrasound guidance the right common femoral artery was cannulated with a micropunch needle and a Michael puncture sheath introduced over a wire. This did take several attempts as the wire initially did not thread easily. A 6 French sheath was then placed and the patient received an additional thousand units of heparin. A pressure was measured across the stent by advancing a straight catheter above the stent and pulling it back. There was a 15 mmHg gradient at rest. I went back with an 8 mm x 2 cm balloon which was positioned within the stent and inflated to 14 atm for 1 minute. Completion film showed good result. I then measured a pressure  in the distal aorta and common femoral artery and there was no significant pressure gradient.   FINDINGS:  1. There are single renal arteries bilaterally with no significant renal artery stenosis identified.  2. The infrarenal aorta is widely  patent. 3. On the left side,there was a tight 80% stenosis in the distal left common iliac artery and proximal left external iliac artery which was successfully addressed with angioplasty and stenting as described above. There was no residual stenosis. Below that the external iliac artery, common femoral artery, deep femoral artery, superficial femoral artery, popliteal, anterior tibial, tibial peroneal trunk, posterior tibial, NP regarding arteries were patent. 4. On the right side, there was some in-stent stenosis with a 15 mmHg pressure gradient at rest. This was addressed with balloon angioplasty with no residual gradient at the completion.  Below that the external iliac artery, hypogastric, common femoral, deep femoral, superficial femoral, popliteal,  And posterior tibial arteries were patent.There was a high takeoff of a common trunk for the anterior tibial and peroneal arteries which were patent.    Deitra Mayo, MD, FACS [facility]  DATE OF DICTATION:   01/26/2016

## 2016-01-27 LAB — POCT ACTIVATED CLOTTING TIME: ACTIVATED CLOTTING TIME: 173 s

## 2016-03-01 ENCOUNTER — Encounter: Payer: Self-pay | Admitting: Vascular Surgery

## 2016-03-10 ENCOUNTER — Encounter: Payer: BLUE CROSS/BLUE SHIELD | Admitting: Vascular Surgery

## 2016-03-10 ENCOUNTER — Ambulatory Visit (HOSPITAL_COMMUNITY): Payer: BLUE CROSS/BLUE SHIELD | Attending: Vascular Surgery

## 2016-03-29 ENCOUNTER — Other Ambulatory Visit: Payer: Self-pay | Admitting: Surgery

## 2016-03-29 MED ORDER — CLOPIDOGREL BISULFATE 75 MG PO TABS: 75.0000 mg | ORAL_TABLET | Freq: Every day | ORAL | 7 refills | Status: DC

## 2016-04-20 ENCOUNTER — Encounter (HOSPITAL_COMMUNITY): Payer: Self-pay | Admitting: *Deleted

## 2016-04-20 ENCOUNTER — Encounter (HOSPITAL_COMMUNITY): Payer: BLUE CROSS/BLUE SHIELD

## 2016-04-21 ENCOUNTER — Encounter: Payer: BLUE CROSS/BLUE SHIELD | Admitting: Vascular Surgery

## 2016-04-30 ENCOUNTER — Encounter: Payer: Self-pay | Admitting: Vascular Surgery

## 2016-05-05 ENCOUNTER — Ambulatory Visit (HOSPITAL_COMMUNITY)
Admission: RE | Admit: 2016-05-05 | Discharge: 2016-05-05 | Disposition: A | Discharge status: Home / Self Care | Payer: BLUE CROSS/BLUE SHIELD | Source: Ambulatory Visit | Attending: Vascular Surgery | Admitting: Vascular Surgery

## 2016-05-05 ENCOUNTER — Encounter: Payer: Self-pay | Admitting: Vascular Surgery

## 2016-05-05 ENCOUNTER — Ambulatory Visit (INDEPENDENT_AMBULATORY_CARE_PROVIDER_SITE_OTHER): Payer: BLUE CROSS/BLUE SHIELD | Admitting: Vascular Surgery

## 2016-05-05 VITALS — BP 136/75 | HR 50 | Temp 97.5°F | Ht 71.0 in | Wt 179.5 lb

## 2016-05-05 DIAGNOSIS — R0989 Other specified symptoms and signs involving the circulatory and respiratory systems: Secondary | ICD-10-CM | POA: Diagnosis present

## 2016-05-05 DIAGNOSIS — Z9862 Peripheral vascular angioplasty status: Secondary | ICD-10-CM

## 2016-05-05 DIAGNOSIS — R938 Abnormal findings on diagnostic imaging of other specified body structures: Secondary | ICD-10-CM | POA: Diagnosis not present

## 2016-05-05 DIAGNOSIS — I10 Essential (primary) hypertension: Secondary | ICD-10-CM | POA: Insufficient documentation

## 2016-05-05 DIAGNOSIS — Z720 Tobacco use: Secondary | ICD-10-CM | POA: Insufficient documentation

## 2016-05-05 DIAGNOSIS — E785 Hyperlipidemia, unspecified: Secondary | ICD-10-CM | POA: Insufficient documentation

## 2016-05-05 DIAGNOSIS — I739 Peripheral vascular disease, unspecified: Secondary | ICD-10-CM

## 2016-05-05 DIAGNOSIS — Z95828 Presence of other vascular implants and grafts: Secondary | ICD-10-CM

## 2016-05-05 DIAGNOSIS — Z48812 Encounter for surgical aftercare following surgery on the circulatory system: Secondary | ICD-10-CM

## 2016-05-05 NOTE — Progress Notes (Signed)
Patient name: Jason Mccall MRN: [id number] DOB: 04/03/1961 Sex: male  REASON FOR VISIT: Follow up after angioplasty and stenting of bilateral common iliac arteries.  HPI: Jason Mccall is a 55 y.o. male who presented with disabling claudication. The patient had undergone previous right common iliac artery stenting by Dr. Liliane BadeGreg Hayes. He had elevated velocities in the stent and also disease in the left common iliac artery. On 01/26/2016, he underwent angioplasty and stenting of the left common iliac artery and also balloon angioplasty of an in-stent stenosis of the right common iliac artery. He comes in for a routine 3 month follow up visit.   He denies claudication, rest pain, or nonhealing ulcers. Unfortunate, he does continue to smoke. He smokes a pack per day of cigarettes. He does remain very active both at work and also is an avid Therapist, nutritionalhunter.  He is on aspirin, Plavix, and a statin.  Past Medical History:  Diagnosis Date  . BPH (benign prostatic hypertrophy)   . Cataract    Right Eye  . Depression   . ED (erectile dysfunction)   . Hyperlipidemia   . Peripheral vascular disease (HCC)   . Restless leg syndrome     Family History  Problem Relation Age of Onset  . Heart disease Mother   . Hyperlipidemia Mother   . Hypertension Mother   . Heart disease Father   . Hyperlipidemia Father   . Hypertension Father   . Heart attack Father   . Brain cancer Brother [id number]  . Cancer Brother   . Hyperlipidemia Brother   . Bleeding Disorder Brother   . Cancer Sister   . Hyperlipidemia Sister   . Hyperlipidemia Sister     SOCIAL HISTORY: Social History  Substance Use Topics  . Smoking status: Current Every Day Smoker    Packs/day: 1.50    Years: 30.00    Types: Cigarettes  . Smokeless tobacco: Never Used  . Alcohol use Yes     Comment: occasional    No Known Allergies  Current Outpatient Prescriptions  Medication Sig Dispense Refill  . aspirin 81 MG tablet Take 81 mg by mouth  daily.    Marland Kitchen. buPROPion (WELLBUTRIN XL) 300 MG 24 hr tablet Take 300 mg by mouth daily.  1  . citalopram (CELEXA) 40 MG tablet Take 40 mg by mouth daily.    . clopidogrel (PLAVIX) 75 MG tablet Take 1 tablet (75 mg total) by mouth daily. 30 tablet 7  . losartan-hydrochlorothiazide (HYZAAR) 100-12.5 MG tablet Take 1 tablet by mouth daily.   3  . Omega-3 Fatty Acids (FISH OIL) 1000 MG CAPS Take 1,000 mg by mouth daily. Reported on 01/14/2016    . pramipexole (MIRAPEX) 0.25 MG tablet Take 0.25 mg by mouth every evening. Reported on 01/14/2016    . simvastatin (ZOCOR) 40 MG tablet Take 40 mg by mouth every evening.    . tadalafil (CIALIS) 10 MG tablet Take 10 mg by mouth daily as needed for erectile dysfunction.     No current facility-administered medications for this visit.     REVIEW OF SYSTEMS:  [X]  denotes positive finding, [ ]  denotes negative finding Cardiac  Comments:  Chest pain or chest pressure:    Shortness of breath upon exertion:    Short of breath when lying flat:    Irregular heart rhythm:        Vascular    Pain in calf, thigh, or hip brought on by ambulation:    Pain  in feet at night that wakes you up from your sleep:     Blood clot in your veins:    Leg swelling:         Pulmonary    Oxygen at home:    Productive cough:     Wheezing:         Neurologic    Sudden weakness in arms or legs:     Sudden numbness in arms or legs:     Sudden onset of difficulty speaking or slurred speech:    Temporary loss of vision in one eye:     Problems with dizziness:         Gastrointestinal    Blood in stool:     Vomited blood:         Genitourinary    Burning when urinating:     Blood in urine:        Psychiatric    Major depression:         Hematologic    Bleeding problems:    Problems with blood clotting too easily:        Skin    Rashes or ulcers:        Constitutional    Fever or chills:      PHYSICAL EXAM: Vitals:   05/05/16 1056  BP: 136/75  Pulse: (!)  50  Temp: 97.5 F (36.4 C)  TempSrc: Oral  SpO2: 98%  Weight: 179 lb 8 oz (81.4 kg)  Height: 5\' 11"  (1.803 m)    GENERAL: The patient is a well-nourished male, in no acute distress. The vital signs are documented above. CARDIAC: There is a regular rate and rhythm.  VASCULAR: I do not detect carotid bruits. He has palpable femoral pulses and palpable posterior tibial pulses bilaterally. PULMONARY: There is good air exchange bilaterally without wheezing or rales. ABDOMEN: Soft and non-tender with normal pitched bowel sounds.  MUSCULOSKELETAL: There are no major deformities or cyanosis. NEUROLOGIC: No focal weakness or paresthesias are detected. SKIN: There are no ulcers or rashes noted. PSYCHIATRIC: The patient has a normal affect.  DATA:   LOWER EXTREMITY ARTERIAL DOPPLER STUDY: I have been apparently interpreted his lower extremity arterial Doppler study.  On the right side, he has a triphasic posterior tibial signal and a biphasic dorsalis pedis signal. ABIs 100%.  On the left side, he has a triphasic dorsalis pedis and posterior tibial signal. ABI on the left is 100%.  MEDICAL ISSUES:  STATUS POST BILATERAL COMMON ILIAC ARTERY STENTS: The patient has normal ABIs and biphasic Doppler signals in both feet. We have again discussed the importance of tobacco cessation. I encouraged him to stay as active as possible. I have ordered follow up ABIs and duplex of his iliac arteries in 6 months. I'll see him back at that time. He knows to call sooner if he has problems.  Waverly Ferrari [facility] [place] [phone]

## 2016-05-07 NOTE — Addendum Note (Signed)
Addended by: Melodye PedMANESS-HARRISON, Ashtan Laton C on: 05/07/2016 02:59 PM   Modules accepted: Orders

## 2016-11-05 ENCOUNTER — Encounter: Payer: Self-pay | Admitting: Vascular Surgery

## 2016-11-09 NOTE — Addendum Note (Signed)
Addended by: Burton ApleyPETTY, Ameah Chanda A on: 11/09/2016 10:09 AM   Modules accepted: Orders

## 2016-11-17 ENCOUNTER — Ambulatory Visit (INDEPENDENT_AMBULATORY_CARE_PROVIDER_SITE_OTHER)
Admission: RE | Admit: 2016-11-17 | Discharge: 2016-11-17 | Disposition: A | Discharge status: Home / Self Care | Payer: BLUE CROSS/BLUE SHIELD | Source: Ambulatory Visit | Attending: Vascular Surgery | Admitting: Vascular Surgery

## 2016-11-17 ENCOUNTER — Ambulatory Visit (INDEPENDENT_AMBULATORY_CARE_PROVIDER_SITE_OTHER): Payer: BLUE CROSS/BLUE SHIELD | Admitting: Vascular Surgery

## 2016-11-17 ENCOUNTER — Encounter: Payer: Self-pay | Admitting: Vascular Surgery

## 2016-11-17 ENCOUNTER — Ambulatory Visit (HOSPITAL_COMMUNITY)
Admission: RE | Admit: 2016-11-17 | Discharge: 2016-11-17 | Disposition: A | Discharge status: Home / Self Care | Payer: BLUE CROSS/BLUE SHIELD | Source: Ambulatory Visit | Attending: Vascular Surgery | Admitting: Vascular Surgery

## 2016-11-17 VITALS — BP 137/69 | HR 56 | Temp 97.3°F | Resp 19 | Ht 71.0 in | Wt 182.0 lb

## 2016-11-17 DIAGNOSIS — Z95828 Presence of other vascular implants and grafts: Secondary | ICD-10-CM | POA: Diagnosis not present

## 2016-11-17 DIAGNOSIS — Z48812 Encounter for surgical aftercare following surgery on the circulatory system: Secondary | ICD-10-CM | POA: Insufficient documentation

## 2016-11-17 DIAGNOSIS — Z95820 Peripheral vascular angioplasty status with implants and grafts: Secondary | ICD-10-CM | POA: Insufficient documentation

## 2016-11-17 DIAGNOSIS — I70209 Unspecified atherosclerosis of native arteries of extremities, unspecified extremity: Secondary | ICD-10-CM | POA: Diagnosis not present

## 2016-11-17 NOTE — Addendum Note (Signed)
Addended by: Burton ApleyPETTY, Kelly Eisler A on: 11/17/2016 02:09 PM   Modules accepted: Orders

## 2016-11-17 NOTE — Progress Notes (Signed)
Patient name: Jason MalletWilliam D Mccall MRN: [id number] DOB: 03/30/1961 Sex: male  REASON FOR VISIT: Follow up of peripheral vascular disease.  HPI: Jason Mccall is a 56 y.o. male who originally presented with disabling claudication. He undergone a right common iliac artery stent by Dr. Liliane BadeGreg Hayes. He was found to have a recurrent stenosis. On 01/26/2016 he underwent angioplasty and stenting of the left common iliac artery and balloon angioplasty of an in stent stenosis in the right common iliac artery. Back in August the patient had normal ABIs and he was set up for a 6 month follow up visit. At that time we discussed the importance of tobacco cessation. I encouraged him to stay as active as possible.  Since I saw him last he has been doing well. He denies any claudication, rest pain, or nonhealing ulcers. He does continue to smoke 1 pack per day of cigarettes.  Past Medical History:  Diagnosis Date  . BPH (benign prostatic hypertrophy)   . Cataract    Right Eye  . Depression   . ED (erectile dysfunction)   . Hyperlipidemia   . Peripheral vascular disease (HCC)   . Restless leg syndrome     Family History  Problem Relation Age of Onset  . Heart disease Mother   . Hyperlipidemia Mother   . Hypertension Mother   . Heart disease Father   . Hyperlipidemia Father   . Hypertension Father   . Heart attack Father   . Brain cancer Brother [id number]  . Cancer Brother   . Hyperlipidemia Brother   . Bleeding Disorder Brother   . Cancer Sister   . Hyperlipidemia Sister   . Hyperlipidemia Sister     SOCIAL HISTORY: Social History  Substance Use Topics  . Smoking status: Current Every Day Smoker    Packs/day: 1.50    Years: 30.00    Types: Cigarettes  . Smokeless tobacco: Never Used  . Alcohol use Yes     Comment: occasional    No Known Allergies  Current Outpatient Prescriptions  Medication Sig Dispense Refill  . aspirin 81 MG tablet Take 81 mg by mouth daily.    Marland Kitchen. buPROPion (WELLBUTRIN  XL) 300 MG 24 hr tablet Take 300 mg by mouth daily.  1  . citalopram (CELEXA) 40 MG tablet Take 40 mg by mouth daily.    . clopidogrel (PLAVIX) 75 MG tablet Take 1 tablet (75 mg total) by mouth daily. 30 tablet 7  . losartan-hydrochlorothiazide (HYZAAR) 100-12.5 MG tablet Take 1 tablet by mouth daily.   3  . Omega-3 Fatty Acids (FISH OIL) 1000 MG CAPS Take 1,000 mg by mouth daily. Reported on 01/14/2016    . pramipexole (MIRAPEX) 0.25 MG tablet Take 0.25 mg by mouth every evening. Reported on 01/14/2016    . simvastatin (ZOCOR) 40 MG tablet Take 40 mg by mouth every evening.    . tadalafil (CIALIS) 10 MG tablet Take 10 mg by mouth daily as needed for erectile dysfunction.     No current facility-administered medications for this visit.     REVIEW OF SYSTEMS:  [X]  denotes positive finding, [ ]  denotes negative finding Cardiac  Comments:  Chest pain or chest pressure:    Shortness of breath upon exertion: X   Short of breath when lying flat:    Irregular heart rhythm:        Vascular    Pain in calf, thigh, or hip brought on by ambulation:    Pain in feet  at night that wakes you up from your sleep:     Blood clot in your veins:    Leg swelling:         Pulmonary    Oxygen at home:    Productive cough:  X   Wheezing:         Neurologic    Sudden weakness in arms or legs:     Sudden numbness in arms or legs:     Sudden onset of difficulty speaking or slurred speech:    Temporary loss of vision in one eye:     Problems with dizziness:         Gastrointestinal    Blood in stool:     Vomited blood:         Genitourinary    Burning when urinating:     Blood in urine:        Psychiatric    Major depression:         Hematologic    Bleeding problems:    Problems with blood clotting too easily:        Skin    Rashes or ulcers:        Constitutional    Fever or chills:      PHYSICAL EXAM: Vitals:   11/17/16 1012  BP: 137/69  Pulse: (!) 56  Resp: 19  Temp: 97.3 F  (36.3 C)  TempSrc: Oral  SpO2: 96%  Weight: 182 lb (82.6 kg)  Height: 5\' 11"  (1.803 m)    GENERAL: The patient is a well-nourished male, in no acute distress. The vital signs are documented above. CARDIAC: There is a regular rate and rhythm.  VASCULAR: I do not detect carotid bruits. He has palpable femoral and posterior tibial pulses bilaterally. He has no significant lower extremity swelling. PULMONARY: There is good air exchange bilaterally without wheezing or rales. ABDOMEN: Soft and non-tender with normal pitched bowel sounds.  MUSCULOSKELETAL: There are no major deformities or cyanosis. NEUROLOGIC: No focal weakness or paresthesias are detected. SKIN: There are no ulcers or rashes noted. PSYCHIATRIC: The patient has a normal affect.  DATA:   DUPLEX OF BILATERAL ILIAC ARTERIES: I have been intimately interpreted the duplex of his iliac arteries.  On the right side there are elevated velocities in the central portion of the stent.  On the left side there are elevated velocities in the proximal stent and distal stent.  BILATERAL LOWER EXTREMITY ARTERIAL DOPPLER STUDY: I have independent of interpreted his bilateral lower extremity arterial Doppler study.  On the right side he has a triphasic dorsalis pedis and posterior tibial signal. ABI is 100%. Toe pressure is 104 mmHg.  On the left side, he has a triphasic dorsalis pedis and posterior tibial signal. ABI is 100%. Toe pressures are 104 mmHg.  MEDICAL ISSUES:  STATUS POST BILATERAL COMMON ILIAC ARTERY STENTS: The patient does have some evidence of recurrent stenosis within his iliac artery stents. However he is asymptomatic, has normal ABIs, and palpable pedal pulses. We have again discussed the importance of tobacco cessation. I have encouraged him to stay as active as possible. He will remain on his aspirin and statin. I'll see him back in 6 months with follow up ABIs and duplex scan. He knows to call sooner if he has  problems.    Waverly Ferrari [facility] [place] [phone]

## 2017-03-01 ENCOUNTER — Other Ambulatory Visit: Payer: Self-pay

## 2017-03-01 MED ORDER — CLOPIDOGREL BISULFATE 75 MG PO TABS: 75.0000 mg | ORAL_TABLET | Freq: Every day | ORAL | 7 refills | Status: DC

## 2017-05-25 ENCOUNTER — Encounter (HOSPITAL_COMMUNITY): Payer: BLUE CROSS/BLUE SHIELD

## 2017-05-25 ENCOUNTER — Ambulatory Visit: Payer: BLUE CROSS/BLUE SHIELD | Admitting: Vascular Surgery

## 2017-05-25 ENCOUNTER — Inpatient Hospital Stay (HOSPITAL_COMMUNITY): Admission: RE | Admit: 2017-05-25 | Payer: BLUE CROSS/BLUE SHIELD | Source: Ambulatory Visit

## 2018-02-15 ENCOUNTER — Ambulatory Visit (INDEPENDENT_AMBULATORY_CARE_PROVIDER_SITE_OTHER)
Admission: RE | Admit: 2018-02-15 | Discharge: 2018-02-15 | Disposition: A | Discharge status: Home / Self Care | Payer: Managed Care, Other (non HMO) | Source: Ambulatory Visit | Attending: Vascular Surgery | Admitting: Vascular Surgery

## 2018-02-15 ENCOUNTER — Ambulatory Visit (INDEPENDENT_AMBULATORY_CARE_PROVIDER_SITE_OTHER): Payer: Managed Care, Other (non HMO) | Admitting: Vascular Surgery

## 2018-02-15 ENCOUNTER — Encounter: Payer: Self-pay | Admitting: Vascular Surgery

## 2018-02-15 ENCOUNTER — Other Ambulatory Visit: Payer: Self-pay

## 2018-02-15 ENCOUNTER — Ambulatory Visit (HOSPITAL_COMMUNITY)
Admission: RE | Admit: 2018-02-15 | Discharge: 2018-02-15 | Disposition: A | Discharge status: Home / Self Care | Payer: Managed Care, Other (non HMO) | Source: Ambulatory Visit | Attending: Vascular Surgery | Admitting: Vascular Surgery

## 2018-02-15 VITALS — BP 158/70 | HR 51 | Resp 20 | Ht 71.0 in | Wt 190.0 lb

## 2018-02-15 DIAGNOSIS — R9389 Abnormal findings on diagnostic imaging of other specified body structures: Secondary | ICD-10-CM | POA: Insufficient documentation

## 2018-02-15 DIAGNOSIS — R0989 Other specified symptoms and signs involving the circulatory and respiratory systems: Secondary | ICD-10-CM | POA: Diagnosis present

## 2018-02-15 DIAGNOSIS — I70209 Unspecified atherosclerosis of native arteries of extremities, unspecified extremity: Secondary | ICD-10-CM

## 2018-02-15 DIAGNOSIS — Z95828 Presence of other vascular implants and grafts: Secondary | ICD-10-CM | POA: Insufficient documentation

## 2018-02-15 NOTE — Progress Notes (Signed)
Patient name: Jason Mccall MRN: [id number] DOB: 1961/05/07 Sex: male  REASON FOR VISIT:   Follow-up of bilateral iliac stents.  HPI:   Jason Mccall is a pleasant 57 y.o. male who is had a previous right common iliac artery stent placed by Dr. Liliane Bade.  He later developed a recurrent stenosis and on 01/26/2016 had angioplasty and stenting of this and also stenting of the right common iliac artery.  When I last saw him on 11/17/2016 he had an ABI of 100% on the right and 100% on the left.  He has some evidence of recurrent stenosis within his stents however he was asymptomatic with palpable pedal pulses.  Set him up for a six-month follow-up visit.  Since I saw him last he said no significant changes in his medical history.  He has cut back to 1 pack/day of cigarettes.  He describes cramping pain in his calves at night but no rest pain.  If he walks uphill he does experience some calf claudication bilaterally which is equal on both sides.  His symptoms are brought on by ambulation and relieved with rest.  There are no other aggravating or alleviating factors.  He denies any history of nonhealing wounds.  He is on aspirin, Plavix, and a statin.  Past Medical History:  Diagnosis Date  . BPH (benign prostatic hypertrophy)   . Cataract    Right Eye  . Depression   . ED (erectile dysfunction)   . Hyperlipidemia   . Peripheral vascular disease (HCC)   . Restless leg syndrome     Family History  Problem Relation Age of Onset  . Heart disease Mother   . Hyperlipidemia Mother   . Hypertension Mother   . Heart disease Father   . Hyperlipidemia Father   . Hypertension Father   . Heart attack Father   . Brain cancer Brother 66  . Cancer Brother   . Hyperlipidemia Brother   . Bleeding Disorder Brother   . Cancer Sister   . Hyperlipidemia Sister   . Hyperlipidemia Sister     SOCIAL HISTORY: Social History   Tobacco Use  . Smoking status: Current Every Day Smoker    Packs/day:  1.50    Years: 30.00    Pack years: 45.00    Types: Cigarettes  . Smokeless tobacco: Never Used  Substance Use Topics  . Alcohol use: Yes    Comment: occasional    No Known Allergies  Current Outpatient Medications  Medication Sig Dispense Refill  . aspirin 81 MG tablet Take 81 mg by mouth daily.    Marland Kitchen buPROPion (WELLBUTRIN XL) 300 MG 24 hr tablet Take 300 mg by mouth daily.  1  . citalopram (CELEXA) 40 MG tablet Take 40 mg by mouth daily.    . clopidogrel (PLAVIX) 75 MG tablet Take 1 tablet (75 mg total) by mouth daily. 30 tablet 7  . losartan-hydrochlorothiazide (HYZAAR) 100-12.5 MG tablet Take 1 tablet by mouth daily.   3  . Omega-3 Fatty Acids (FISH OIL) 1000 MG CAPS Take 1,000 mg by mouth daily. Reported on 01/14/2016    . pramipexole (MIRAPEX) 0.25 MG tablet Take 0.25 mg by mouth every evening. Reported on 01/14/2016    . simvastatin (ZOCOR) 40 MG tablet Take 40 mg by mouth every evening.    . tadalafil (CIALIS) 10 MG tablet Take 10 mg by mouth daily as needed for erectile dysfunction.     No current facility-administered medications for this visit.  REVIEW OF SYSTEMS:  [X]  denotes positive finding, [ ]  denotes negative finding Cardiac  Comments:  Chest pain or chest pressure:    Shortness of breath upon exertion:    Short of breath when lying flat:    Irregular heart rhythm:        Vascular    Pain in calf, thigh, or hip brought on by ambulation: x   Pain in feet at night that wakes you up from your sleep:     Blood clot in your veins:    Leg swelling:         Pulmonary    Oxygen at home:    Productive cough:     Wheezing:         Neurologic    Sudden weakness in arms or legs:     Sudden numbness in arms or legs:     Sudden onset of difficulty speaking or slurred speech:    Temporary loss of vision in one eye:     Problems with dizziness:         Gastrointestinal    Blood in stool:     Vomited blood:         Genitourinary    Burning when urinating:       Blood in urine:        Psychiatric    Major depression:         Hematologic    Bleeding problems:    Problems with blood clotting too easily:        Skin    Rashes or ulcers:        Constitutional    Fever or chills:     PHYSICAL EXAM:   Vitals:   02/15/18 0939 02/15/18 0943  BP: (!) 155/74 (!) 158/70  Pulse: (!) 51   Resp: 20   SpO2: 96%   Weight: 190 lb (86.2 kg)   Height: 5\' 11"  (1.803 m)     GENERAL: The patient is a well-nourished male, in no acute distress. The vital signs are documented above. CARDIAC: There is a regular rate and rhythm.  VASCULAR: I do not detect carotid bruits. He has palpable femoral pulses and posterior tibial pulses bilaterally. PULMONARY: There is good air exchange bilaterally without wheezing or rales. ABDOMEN: Soft and non-tender with normal pitched bowel sounds.  MUSCULOSKELETAL: There are no major deformities or cyanosis. NEUROLOGIC: No focal weakness or paresthesias are detected. SKIN: There are no ulcers or rashes noted. PSYCHIATRIC: The patient has a normal affect.  DATA:    LOWER EXTREMITY ARTERIAL DOPPLER STUDY: I have independently interpreted his lower extremity arterial Doppler study today.  On the right side he has a triphasic dorsalis pedis and posterior tibial waveform.  ABI is 98%.  Toe pressure is 91 mmHg.  On the left side, he has a triphasic anterior tibial signal.  ABIs 100%.  Toe pressure on the right is 131 mmHg.  DUPLEX BILATERAL COMMON ILIAC ARTERIES: I have independently interpreted his duplex of his common iliac artery stents.  Does have some elevated velocities in the proximal mid and distal stent on the right felt to be greater than 50%.  On the left side he has elevated velocities in the mid stent.  Again the stenosis is felt to be greater than 50%.  MEDICAL ISSUES:   STATUS POST BILATERAL ILIAC ARTERY STENTING: This patient had undergone previous right common iliac artery angioplasty and stenting by Dr.  Liliane Bade.  Most recently on 01/26/2016, I  performed angioplasty and stenting of the left common iliac artery and also angioplasty of an in-stent stenosis in the right common iliac artery stent.  Although he does have some recurrent stenosis in both areas, he has palpable pulses and ABIs are normal.  Given that he has a propensity to recurrent stenoses I am reluctant to intervene again unless he develops a drop in his ABIs or significant progression of his disease.  We have again discussed the importance of tobacco cessation.  He is on aspirin, Plavix, and a statin.  I have ordered a follow-up duplex of his stents and ABIs in 6 months and I will see him back at that time.  He knows to call sooner if he has problems.  Also encouraged him to stay as active as possible.  Waverly Ferrarihristopher Alawna Graybeal [facility] [phone]

## 2018-02-16 ENCOUNTER — Other Ambulatory Visit: Payer: Self-pay | Admitting: *Deleted

## 2018-02-16 MED ORDER — CLOPIDOGREL BISULFATE 75 MG PO TABS: 75.0000 mg | ORAL_TABLET | Freq: Every day | ORAL | 7 refills | Status: DC

## 2018-02-22 ENCOUNTER — Other Ambulatory Visit: Payer: Self-pay

## 2018-02-22 DIAGNOSIS — I70209 Unspecified atherosclerosis of native arteries of extremities, unspecified extremity: Secondary | ICD-10-CM

## 2018-08-16 ENCOUNTER — Encounter (HOSPITAL_COMMUNITY): Payer: Managed Care, Other (non HMO)

## 2018-08-16 ENCOUNTER — Inpatient Hospital Stay (HOSPITAL_COMMUNITY): Admission: RE | Admit: 2018-08-16 | Payer: Managed Care, Other (non HMO) | Source: Ambulatory Visit

## 2018-08-16 ENCOUNTER — Ambulatory Visit: Payer: Managed Care, Other (non HMO) | Admitting: Vascular Surgery

## 2018-08-17 ENCOUNTER — Encounter: Payer: Self-pay | Admitting: Vascular Surgery

## 2018-10-18 ENCOUNTER — Ambulatory Visit (INDEPENDENT_AMBULATORY_CARE_PROVIDER_SITE_OTHER): Payer: Managed Care, Other (non HMO) | Admitting: Vascular Surgery

## 2018-10-18 ENCOUNTER — Ambulatory Visit (HOSPITAL_COMMUNITY)
Admission: RE | Admit: 2018-10-18 | Discharge: 2018-10-18 | Disposition: A | Discharge status: Home / Self Care | Payer: Managed Care, Other (non HMO) | Source: Ambulatory Visit | Attending: Vascular Surgery | Admitting: Vascular Surgery

## 2018-10-18 ENCOUNTER — Other Ambulatory Visit: Payer: Self-pay

## 2018-10-18 ENCOUNTER — Encounter: Payer: Self-pay | Admitting: Vascular Surgery

## 2018-10-18 ENCOUNTER — Ambulatory Visit (INDEPENDENT_AMBULATORY_CARE_PROVIDER_SITE_OTHER)
Admission: RE | Admit: 2018-10-18 | Discharge: 2018-10-18 | Disposition: A | Discharge status: Home / Self Care | Payer: Managed Care, Other (non HMO) | Source: Ambulatory Visit | Attending: Vascular Surgery | Admitting: Vascular Surgery

## 2018-10-18 VITALS — BP 114/65 | HR 61 | Temp 97.2°F | Resp 20 | Ht 71.0 in | Wt 190.0 lb

## 2018-10-18 DIAGNOSIS — I70209 Unspecified atherosclerosis of native arteries of extremities, unspecified extremity: Secondary | ICD-10-CM | POA: Insufficient documentation

## 2018-10-18 NOTE — Progress Notes (Signed)
Patient name: Jason MalletWilliam D Mccall MRN: [id number] DOB: 11/09/1960 Sex: male  REASON FOR VISIT:   Follow-up of aortoiliac occlusive disease.  HPI:   Jason Mccall is a pleasant [age] y.o. male who I last saw on 02/15/2018.  He has had a previous right common iliac artery stent placed by Dr. Liliane BadeGreg Hayes.  He later developed a recurrent stenosis in 2017 and angioplasty and stenting of this recurrent stenosis in the right common iliac artery.  I also performed angioplasty and stenting of the left common iliac artery.  When I saw him last on 02/15/2018 he had some evidence of recurrent stenosis in the stents however he had palpable pedal pulses and his ABIs were normal.  We discussed the importance of tobacco cessation.  He was on aspirin Plavix and a statin.  I ordered follow-up studies and a follow-up visit in 6 months.  Since I saw him last, he denies any hip thigh or calf claudication.  He denies any rest pain or history of nonhealing ulcers.  He does continue to smoke but is cut back significantly and is now down to half a pack per day.  There have been no significant changes in his medical history.  He is on aspirin, Plavix, and a statin.  Past Medical History:  Diagnosis Date  . BPH (benign prostatic hypertrophy)   . Cataract    Right Eye  . Depression   . ED (erectile dysfunction)   . Hyperlipidemia   . Peripheral vascular disease (HCC)   . Restless leg syndrome     Family History  Problem Relation Age of Onset  . Heart disease Mother   . Hyperlipidemia Mother   . Hypertension Mother   . Heart disease Father   . Hyperlipidemia Father   . Hypertension Father   . Heart attack Father   . Brain cancer Brother [id number]  . Cancer Brother   . Hyperlipidemia Brother   . Bleeding Disorder Brother   . Cancer Sister   . Hyperlipidemia Sister   . Hyperlipidemia Sister     SOCIAL HISTORY: Social History   Tobacco Use  . Smoking status: Current Every Day Smoker    Packs/day: 1.50    Years:  30.00    Pack years: 45.00    Types: Cigarettes  . Smokeless tobacco: Never Used  Substance Use Topics  . Alcohol use: Yes    Comment: occasional    No Known Allergies  Current Outpatient Medications  Medication Sig Dispense Refill  . aspirin 81 MG tablet Take 81 mg by mouth daily.    Marland Kitchen. buPROPion (WELLBUTRIN XL) 300 MG 24 hr tablet Take 300 mg by mouth daily.  1  . citalopram (CELEXA) 40 MG tablet Take 40 mg by mouth daily.    . clopidogrel (PLAVIX) 75 MG tablet Take 1 tablet (75 mg total) by mouth daily. 30 tablet 7  . losartan-hydrochlorothiazide (HYZAAR) 100-12.5 MG tablet Take 1 tablet by mouth daily.   3  . Omega-3 Fatty Acids (FISH OIL) 1000 MG CAPS Take 1,000 mg by mouth daily. Reported on 01/14/2016    . pramipexole (MIRAPEX) 0.25 MG tablet Take 0.25 mg by mouth every evening. Reported on 01/14/2016    . simvastatin (ZOCOR) 40 MG tablet Take 40 mg by mouth every evening.    . tadalafil (CIALIS) 10 MG tablet Take 10 mg by mouth daily as needed for erectile dysfunction.     No current facility-administered medications for this visit.  REVIEW OF SYSTEMS:  [X]  denotes positive finding, [ ]  denotes negative finding Cardiac  Comments:  Chest pain or chest pressure:    Shortness of breath upon exertion:    Short of breath when lying flat:    Irregular heart rhythm:        Vascular    Pain in calf, thigh, or hip brought on by ambulation:    Pain in feet at night that wakes you up from your sleep:     Blood clot in your veins:    Leg swelling:         Pulmonary    Oxygen at home:    Productive cough:     Wheezing:         Neurologic    Sudden weakness in arms or legs:     Sudden numbness in arms or legs:     Sudden onset of difficulty speaking or slurred speech:    Temporary loss of vision in one eye:     Problems with dizziness:         Gastrointestinal    Blood in stool:     Vomited blood:         Genitourinary    Burning when urinating:     Blood in urine:         Psychiatric    Major depression:         Hematologic    Bleeding problems:    Problems with blood clotting too easily:        Skin    Rashes or ulcers:        Constitutional    Fever or chills:     PHYSICAL EXAM:   Vitals:   10/18/18 0922  BP: 114/65  Pulse: 61  Resp: 20  Temp: (!) 97.2 F (36.2 C)  SpO2: 95%  Weight: 190 lb (86.2 kg)  Height: 5\' 11"  (1.803 m)    GENERAL: The patient is a well-nourished male, in no acute distress. The vital signs are documented above. CARDIAC: There is a regular rate and rhythm.  VASCULAR: I do not detect carotid bruits. He has palpable femoral pulses bilaterally and palpable posterior tibial pulses bilaterally. He has no significant lower extremity swelling. PULMONARY: There is good air exchange bilaterally without wheezing or rales. ABDOMEN: Soft and non-tender with normal pitched bowel sounds.  MUSCULOSKELETAL: There are no major deformities or cyanosis. NEUROLOGIC: No focal weakness or paresthesias are detected. SKIN: There are no ulcers or rashes noted. PSYCHIATRIC: The patient has a normal affect.  DATA:    AORTOILIAC DUPLEX: I have independently interpreted his duplex of his iliac stents.  On the right side peak systolic velocities 312 cm/s suggesting a greater than 50% stenosis in the proximal stent.  On the left side peak systolic velocities 346 cm/s again suggesting a greater than 50% stenosis in the proximal stent.  ARTERIAL DOPPLER STUDY: I have independently interpreted his arterial Doppler study today.  On the right side he has a triphasic posterior tibial signal with a monophasic dorsalis pedis signal.  ABI is 100%.  Toe pressure 61 mmHg.  On the left side he has a biphasic dorsalis pedis signal with a triphasic posterior tibial signal.  ABI is 100%.  Toe pressure on the left is 68 mmHg.  MEDICAL ISSUES:   AORTOILIAC OCCLUSIVE DISEASE: This patient is undergone bilateral common iliac artery angioplasty  and stenting.  He has some recurrent stenosis in the proximal stents bilaterally but palpable femoral pulses,  normal ABIs, and is essentially asymptomatic.  He has had recurrent stenosis before some reluctant to be overly aggressive with his recurrent disease given that it currently is fairly mild.  I have strongly encouraged him to try to get off the cigarettes completely.  We have also discussed the importance of continuing to walk as much as possible.  He will remain on aspirin Plavix and a statin.  I have ordered follow-up studies including arterial Doppler study and duplex of his stents in 1 year and I will see him back at that time.  He knows to call sooner if he has problems.  Waverly Ferrari [facility] [phone]

## 2019-03-05 ENCOUNTER — Other Ambulatory Visit: Payer: Self-pay

## 2019-03-05 ENCOUNTER — Encounter (HOSPITAL_BASED_OUTPATIENT_CLINIC_OR_DEPARTMENT_OTHER): Payer: Self-pay
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
## 2019-03-06 ENCOUNTER — Encounter (HOSPITAL_BASED_OUTPATIENT_CLINIC_OR_DEPARTMENT_OTHER): Payer: Self-pay | Admitting: Physician Assistant

## 2019-03-06 DIAGNOSIS — F329 Major depressive disorder, single episode, unspecified: Secondary | ICD-10-CM | POA: Diagnosis present

## 2019-03-06 DIAGNOSIS — F32A Depression, unspecified: Secondary | ICD-10-CM | POA: Diagnosis present

## 2019-03-06 DIAGNOSIS — I10 Essential (primary) hypertension: Secondary | ICD-10-CM | POA: Diagnosis present

## 2019-03-06 DIAGNOSIS — I739 Peripheral vascular disease, unspecified: Secondary | ICD-10-CM | POA: Diagnosis present

## 2019-03-06 DIAGNOSIS — S46211A Strain of muscle, fascia and tendon of other parts of biceps, right arm, initial encounter: Secondary | ICD-10-CM | POA: Diagnosis present

## 2019-03-06 HISTORY — DX: Strain of muscle, fascia and tendon of other parts of biceps, right arm, initial encounter: S46.211A

## 2019-03-08 ENCOUNTER — Other Ambulatory Visit: Payer: Self-pay

## 2019-03-08 ENCOUNTER — Encounter (HOSPITAL_BASED_OUTPATIENT_CLINIC_OR_DEPARTMENT_OTHER)
Admission: RE | Admit: 2019-03-08 | Discharge: 2019-03-08 | Disposition: A | Discharge status: Home / Self Care | Payer: Managed Care, Other (non HMO) | Source: Ambulatory Visit | Attending: Orthopedic Surgery | Admitting: Orthopedic Surgery

## 2019-03-08 ENCOUNTER — Other Ambulatory Visit (HOSPITAL_COMMUNITY)
Admission: RE | Admit: 2019-03-08 | Discharge: 2019-03-08 | Disposition: A | Discharge status: Home / Self Care | Payer: Managed Care, Other (non HMO) | Source: Ambulatory Visit | Attending: Orthopedic Surgery | Admitting: Orthopedic Surgery

## 2019-03-08 DIAGNOSIS — Z1159 Encounter for screening for other viral diseases: Secondary | ICD-10-CM | POA: Insufficient documentation

## 2019-03-08 DIAGNOSIS — Z01818 Encounter for other preprocedural examination: Secondary | ICD-10-CM | POA: Diagnosis present

## 2019-03-08 DIAGNOSIS — I10 Essential (primary) hypertension: Secondary | ICD-10-CM | POA: Insufficient documentation

## 2019-03-08 DIAGNOSIS — R001 Bradycardia, unspecified: Secondary | ICD-10-CM | POA: Insufficient documentation

## 2019-03-08 LAB — SARS CORONAVIRUS 2 (TAT 6-24 HRS): SARS Coronavirus 2: NEGATIVE

## 2019-03-08 LAB — BASIC METABOLIC PANEL
Anion gap: 8 (ref 5–15)
BUN: 10 mg/dL (ref 6–20)
CO2: 25 mmol/L (ref 22–32)
Calcium: 8.7 mg/dL — ABNORMAL LOW (ref 8.9–10.3)
Chloride: 103 mmol/L (ref 98–111)
Creatinine, Ser: 0.76 mg/dL (ref 0.61–1.24)
GFR calc Af Amer: >60 mL/min (ref >60)
GFR calc non Af Amer: >60 mL/min (ref >60)
Glucose, Bld: 109 mg/dL — ABNORMAL HIGH (ref 70–99)
Potassium: 5.1 mmol/L (ref 3.5–5.1)
Sodium: 136 mmol/L (ref 135–145)

## 2019-03-08 NOTE — Progress Notes (Signed)
Ensure pre surgery drink given with instructions to complete by 0630 dos, pt verbalized understanding. 

## 2019-03-12 ENCOUNTER — Ambulatory Visit (HOSPITAL_BASED_OUTPATIENT_CLINIC_OR_DEPARTMENT_OTHER)
Admission: RE | Admit: 2019-03-12 | Discharge: 2019-03-12 | Disposition: A | Discharge status: Home / Self Care | Payer: Managed Care, Other (non HMO) | Attending: Orthopedic Surgery | Admitting: Orthopedic Surgery

## 2019-03-12 ENCOUNTER — Ambulatory Visit (HOSPITAL_BASED_OUTPATIENT_CLINIC_OR_DEPARTMENT_OTHER): Payer: Managed Care, Other (non HMO) | Admitting: Anesthesiology

## 2019-03-12 ENCOUNTER — Encounter (HOSPITAL_BASED_OUTPATIENT_CLINIC_OR_DEPARTMENT_OTHER): Payer: Self-pay | Admitting: *Deleted

## 2019-03-12 ENCOUNTER — Encounter (HOSPITAL_BASED_OUTPATIENT_CLINIC_OR_DEPARTMENT_OTHER)
Admission: RE | Disposition: A | Discharge status: Home / Self Care | Payer: Self-pay | Source: Home / Self Care | Attending: Orthopedic Surgery

## 2019-03-12 DIAGNOSIS — F329 Major depressive disorder, single episode, unspecified: Secondary | ICD-10-CM | POA: Diagnosis not present

## 2019-03-12 DIAGNOSIS — F32A Depression, unspecified: Secondary | ICD-10-CM | POA: Diagnosis present

## 2019-03-12 DIAGNOSIS — I10 Essential (primary) hypertension: Secondary | ICD-10-CM | POA: Insufficient documentation

## 2019-03-12 DIAGNOSIS — M67823 Other specified disorders of tendon, right elbow: Secondary | ICD-10-CM | POA: Diagnosis not present

## 2019-03-12 DIAGNOSIS — F17210 Nicotine dependence, cigarettes, uncomplicated: Secondary | ICD-10-CM | POA: Insufficient documentation

## 2019-03-12 DIAGNOSIS — N529 Male erectile dysfunction, unspecified: Secondary | ICD-10-CM | POA: Insufficient documentation

## 2019-03-12 DIAGNOSIS — Z79899 Other long term (current) drug therapy: Secondary | ICD-10-CM | POA: Insufficient documentation

## 2019-03-12 DIAGNOSIS — Z7902 Long term (current) use of antithrombotics/antiplatelets: Secondary | ICD-10-CM | POA: Diagnosis not present

## 2019-03-12 DIAGNOSIS — S46211A Strain of muscle, fascia and tendon of other parts of biceps, right arm, initial encounter: Secondary | ICD-10-CM | POA: Diagnosis present

## 2019-03-12 DIAGNOSIS — E785 Hyperlipidemia, unspecified: Secondary | ICD-10-CM | POA: Diagnosis not present

## 2019-03-12 DIAGNOSIS — Z7982 Long term (current) use of aspirin: Secondary | ICD-10-CM | POA: Insufficient documentation

## 2019-03-12 DIAGNOSIS — I739 Peripheral vascular disease, unspecified: Secondary | ICD-10-CM | POA: Diagnosis not present

## 2019-03-12 HISTORY — DX: Essential (primary) hypertension: I10

## 2019-03-12 HISTORY — PX: DISTAL BICEPS TENDON REPAIR: SHX1461

## 2019-03-12 SURGERY — REPAIR, TENDON, BICEPS, DISTAL
Anesthesia: General | Site: Elbow | Laterality: Right

## 2019-03-12 MED ORDER — LACTATED RINGERS IV SOLN: INTRAVENOUS | Status: DC

## 2019-03-12 MED ORDER — DEXAMETHASONE SODIUM PHOSPHATE 10 MG/ML IJ SOLN
INTRAMUSCULAR | Status: DC | PRN
  Administered 2019-03-12: 10 mg via INTRAVENOUS

## 2019-03-12 MED ORDER — SUGAMMADEX SODIUM 200 MG/2ML IV SOLN
INTRAVENOUS | Status: DC | PRN
  Administered 2019-03-12: 200 mg via INTRAVENOUS

## 2019-03-12 MED ORDER — OXYCODONE HCL 5 MG PO TABS: ORAL_TABLET | ORAL | 0 refills | Status: DC

## 2019-03-12 MED ORDER — CHLORHEXIDINE GLUCONATE 4 % EX LIQD: 60.0000 mL | Freq: Once | CUTANEOUS | Status: DC

## 2019-03-12 MED ORDER — OXYCODONE HCL 5 MG PO TABS
5.0000 mg | ORAL_TABLET | Freq: Once | ORAL | Status: AC
  Administered 2019-03-12: 5 mg via ORAL

## 2019-03-12 MED ORDER — ACETAMINOPHEN 500 MG PO TABS
1000.0000 mg | ORAL_TABLET | Freq: Once | ORAL | Status: AC
  Administered 2019-03-12: 1000 mg via ORAL

## 2019-03-12 MED ORDER — ONDANSETRON HCL 4 MG/2ML IJ SOLN
INTRAMUSCULAR | Status: AC
  Filled 2019-03-12: qty 2

## 2019-03-12 MED ORDER — CEFAZOLIN SODIUM-DEXTROSE 2-4 GM/100ML-% IV SOLN
INTRAVENOUS | Status: AC
  Filled 2019-03-12: qty 100

## 2019-03-12 MED ORDER — TIZANIDINE HCL 4 MG PO CAPS: 4.0000 mg | ORAL_CAPSULE | Freq: Three times a day (TID) | ORAL | 0 refills | Status: AC | PRN

## 2019-03-12 MED ORDER — MIDAZOLAM HCL 5 MG/5ML IJ SOLN
INTRAMUSCULAR | Status: DC | PRN
  Administered 2019-03-12: 2 mg via INTRAVENOUS

## 2019-03-12 MED ORDER — SODIUM CHLORIDE (PF) 0.9 % IJ SOLN
INTRAMUSCULAR | Status: AC
  Filled 2019-03-12: qty 10

## 2019-03-12 MED ORDER — ROCURONIUM BROMIDE 10 MG/ML (PF) SYRINGE
PREFILLED_SYRINGE | INTRAVENOUS | Status: DC | PRN
  Administered 2019-03-12: 60 mg via INTRAVENOUS

## 2019-03-12 MED ORDER — ACETAMINOPHEN 500 MG PO TABS
ORAL_TABLET | ORAL | Status: AC
  Filled 2019-03-12: qty 2

## 2019-03-12 MED ORDER — DEXAMETHASONE SODIUM PHOSPHATE 10 MG/ML IJ SOLN
INTRAMUSCULAR | Status: AC
  Filled 2019-03-12: qty 1

## 2019-03-12 MED ORDER — CEFAZOLIN SODIUM-DEXTROSE 2-4 GM/100ML-% IV SOLN
2.0000 g | INTRAVENOUS | Status: AC
  Administered 2019-03-12: 2 g via INTRAVENOUS

## 2019-03-12 MED ORDER — BUPIVACAINE-EPINEPHRINE (PF) 0.25% -1:200000 IJ SOLN
INTRAMUSCULAR | Status: AC
  Filled 2019-03-12: qty 30

## 2019-03-12 MED ORDER — LACTATED RINGERS IV SOLN
INTRAVENOUS | Status: DC
  Administered 2019-03-12 (×2): via INTRAVENOUS

## 2019-03-12 MED ORDER — LIDOCAINE 2% (20 MG/ML) 5 ML SYRINGE
INTRAMUSCULAR | Status: DC | PRN
  Administered 2019-03-12: 100 mg via INTRAVENOUS

## 2019-03-12 MED ORDER — DEXAMETHASONE SODIUM PHOSPHATE 10 MG/ML IJ SOLN: 8.0000 mg | Freq: Once | INTRAMUSCULAR | Status: DC

## 2019-03-12 MED ORDER — FENTANYL CITRATE (PF) 100 MCG/2ML IJ SOLN
INTRAMUSCULAR | Status: AC
  Filled 2019-03-12: qty 2

## 2019-03-12 MED ORDER — PROPOFOL 10 MG/ML IV BOLUS
INTRAVENOUS | Status: DC | PRN
  Administered 2019-03-12: 150 mg via INTRAVENOUS
  Administered 2019-03-12: 50 mg via INTRAVENOUS

## 2019-03-12 MED ORDER — POVIDONE-IODINE 7.5 % EX SOLN: Freq: Once | CUTANEOUS | Status: DC

## 2019-03-12 MED ORDER — POVIDONE-IODINE 10 % EX SWAB: 2.0000 "application " | Freq: Once | CUTANEOUS | Status: DC

## 2019-03-12 MED ORDER — FENTANYL CITRATE (PF) 250 MCG/5ML IJ SOLN
INTRAMUSCULAR | Status: DC | PRN
  Administered 2019-03-12 (×2): 50 ug via INTRAVENOUS

## 2019-03-12 MED ORDER — OXYCODONE HCL 5 MG PO TABS
ORAL_TABLET | ORAL | Status: AC
  Filled 2019-03-12: qty 1

## 2019-03-12 MED ORDER — FENTANYL CITRATE (PF) 100 MCG/2ML IJ SOLN: 50.0000 ug | INTRAMUSCULAR | Status: DC | PRN

## 2019-03-12 MED ORDER — MIDAZOLAM HCL 2 MG/2ML IJ SOLN
INTRAMUSCULAR | Status: AC
  Filled 2019-03-12: qty 2

## 2019-03-12 MED ORDER — ONDANSETRON HCL 4 MG/2ML IJ SOLN
INTRAMUSCULAR | Status: DC | PRN
  Administered 2019-03-12: 4 mg via INTRAVENOUS

## 2019-03-12 MED ORDER — MIDAZOLAM HCL 2 MG/2ML IJ SOLN: 1.0000 mg | INTRAMUSCULAR | Status: DC | PRN

## 2019-03-12 MED ORDER — FENTANYL CITRATE (PF) 100 MCG/2ML IJ SOLN
25.0000 ug | INTRAMUSCULAR | Status: DC | PRN
  Administered 2019-03-12 (×3): 50 ug via INTRAVENOUS

## 2019-03-12 MED ORDER — BUPIVACAINE HCL (PF) 0.25 % IJ SOLN
INTRAMUSCULAR | Status: DC | PRN
  Administered 2019-03-12: 10 mL

## 2019-03-12 SURGICAL SUPPLY — 89 items
APL SKNCLS STERI-STRIP NONHPOA (GAUZE/BANDAGES/DRESSINGS) ×1
BANDAGE ACE 4X5 VEL STRL LF (GAUZE/BANDAGES/DRESSINGS) ×3 IMPLANT
BANDAGE ACE 6X5 VEL STRL LF (GAUZE/BANDAGES/DRESSINGS) ×3 IMPLANT
BENZOIN TINCTURE PRP APPL 2/3 (GAUZE/BANDAGES/DRESSINGS) ×3 IMPLANT
BLADE SURG 15 STRL LF DISP TIS (BLADE) ×2 IMPLANT
BLADE SURG 15 STRL SS (BLADE) ×6
BNDG CMPR 9X4 STRL LF SNTH (GAUZE/BANDAGES/DRESSINGS) ×1
BNDG COHESIVE 4X5 TAN STRL (GAUZE/BANDAGES/DRESSINGS) ×3 IMPLANT
BNDG ESMARK 4X9 LF (GAUZE/BANDAGES/DRESSINGS) ×3 IMPLANT
BNDG GAUZE ELAST 4 BULKY (GAUZE/BANDAGES/DRESSINGS) IMPLANT
BUTTON DISTAL BICEPS (Orthopedic Implant) ×3 IMPLANT
CLOSURE WOUND 1/2 X4 (GAUZE/BANDAGES/DRESSINGS) ×1
COVER BACK TABLE REUSABLE LG (DRAPES) ×3 IMPLANT
COVER WAND RF STERILE (DRAPES) IMPLANT
CUFF TOURN SGL QUICK 18X4 (TOURNIQUET CUFF) ×3 IMPLANT
DRAPE EXTREMITY T 121X128X90 (DISPOSABLE) ×3 IMPLANT
DRAPE IMP U-DRAPE 54X76 (DRAPES) ×3 IMPLANT
DRAPE OEC MINIVIEW 54X84 (DRAPES) ×3 IMPLANT
DRAPE U-SHAPE 47X51 STRL (DRAPES) ×3 IMPLANT
DRSG EMULSION OIL 3X3 NADH (GAUZE/BANDAGES/DRESSINGS) IMPLANT
DURAPREP 26ML APPLICATOR (WOUND CARE) ×3 IMPLANT
ELECT REM PT RETURN 9FT ADLT (ELECTROSURGICAL) ×3
ELECTRODE REM PT RTRN 9FT ADLT (ELECTROSURGICAL) ×1 IMPLANT
GAUZE SPONGE 4X4 12PLY STRL (GAUZE/BANDAGES/DRESSINGS) ×3 IMPLANT
GAUZE XEROFORM 1X8 LF (GAUZE/BANDAGES/DRESSINGS) ×3 IMPLANT
GLOVE BIO SURGEON STRL SZ7 (GLOVE) ×3 IMPLANT
GLOVE BIO SURGEON STRL SZ7.5 (GLOVE) ×6 IMPLANT
GLOVE BIOGEL PI IND STRL 7.0 (GLOVE) ×2 IMPLANT
GLOVE BIOGEL PI IND STRL 7.5 (GLOVE) ×1 IMPLANT
GLOVE BIOGEL PI IND STRL 8 (GLOVE) ×1 IMPLANT
GLOVE BIOGEL PI INDICATOR 7.0 (GLOVE) ×4
GLOVE BIOGEL PI INDICATOR 7.5 (GLOVE) ×2
GLOVE BIOGEL PI INDICATOR 8 (GLOVE) ×2
GLOVE ECLIPSE 6.5 STRL STRAW (GLOVE) ×3 IMPLANT
GLOVE SS BIOGEL STRL SZ 7.5 (GLOVE) ×1 IMPLANT
GLOVE SUPERSENSE BIOGEL SZ 7.5 (GLOVE) ×2
GOWN STRL REUS W/ TWL LRG LVL3 (GOWN DISPOSABLE) ×2 IMPLANT
GOWN STRL REUS W/ TWL XL LVL3 (GOWN DISPOSABLE) ×2 IMPLANT
GOWN STRL REUS W/TWL LRG LVL3 (GOWN DISPOSABLE) ×4
GOWN STRL REUS W/TWL XL LVL3 (GOWN DISPOSABLE) ×6
INSERTER BUTTON (SYSTAGENIX WOUND MANAGEMENT) ×3 IMPLANT
LOOP VESSEL MAXI BLUE (MISCELLANEOUS) IMPLANT
NDL SAFETY ECLIPSE 18X1.5 (NEEDLE) IMPLANT
NDL SUT 6 .5 CRC .975X.05 MAYO (NEEDLE) ×1 IMPLANT
NEEDLE ADDISON D1/2 CIR (NEEDLE) IMPLANT
NEEDLE FISTULA 1/2 CIRCLE (NEEDLE) IMPLANT
NEEDLE HYPO 18GX1.5 SHARP (NEEDLE)
NEEDLE HYPO 25X1 1.5 SAFETY (NEEDLE) ×3 IMPLANT
NEEDLE MAYO TAPER (NEEDLE) ×2
NEEDLE SPNL 18GX3.5 QUINCKE PK (NEEDLE) ×3 IMPLANT
NEEDLE SPNL 20GX3.5 QUINCKE YW (NEEDLE) ×3 IMPLANT
NS IRRIG 1000ML POUR BTL (IV SOLUTION) ×3 IMPLANT
PACK BASIN DAY SURGERY FS (CUSTOM PROCEDURE TRAY) ×3 IMPLANT
PAD CAST 3X4 CTTN HI CHSV (CAST SUPPLIES) IMPLANT
PAD CAST 4YDX4 CTTN HI CHSV (CAST SUPPLIES) ×1 IMPLANT
PADDING CAST ABS 4INX4YD NS (CAST SUPPLIES) ×2
PADDING CAST ABS COTTON 4X4 ST (CAST SUPPLIES) ×1 IMPLANT
PADDING CAST COTTON 3X4 STRL (CAST SUPPLIES)
PADDING CAST COTTON 4X4 STRL (CAST SUPPLIES) ×3
PASSER SUT SWANSON 36MM LOOP (INSTRUMENTS) IMPLANT
PENCIL BUTTON HOLSTER BLD 10FT (ELECTRODE) ×3 IMPLANT
PIN DRILL ACL TIGHTROPE 4MM (PIN) ×3 IMPLANT
SLEEVE SCD COMPRESS KNEE MED (MISCELLANEOUS) IMPLANT
SLING ARM FOAM STRAP LRG (SOFTGOODS) ×3 IMPLANT
SPLINT FAST PLASTER 5X30 (CAST SUPPLIES) ×20
SPLINT PLASTER CAST FAST 5X30 (CAST SUPPLIES) ×10 IMPLANT
STAPLER VISISTAT (STAPLE) IMPLANT
STOCKINETTE IMPERVIOUS LG (DRAPES) ×3 IMPLANT
STRIP CLOSURE SKIN 1/2X4 (GAUZE/BANDAGES/DRESSINGS) ×2 IMPLANT
SUCTION FRAZIER HANDLE 10FR (MISCELLANEOUS) ×2
SUCTION TUBE FRAZIER 10FR DISP (MISCELLANEOUS) ×1 IMPLANT
SUT ETHILON 4 0 PS 2 18 (SUTURE) ×3 IMPLANT
SUT FIBERWIRE 2-0 18 17.9 3/8 (SUTURE)
SUT MNCRL AB 3-0 PS2 18 (SUTURE) IMPLANT
SUT PROLENE 3 0 PS 2 (SUTURE) IMPLANT
SUT VIC AB 0 CT1 27 (SUTURE)
SUT VIC AB 0 CT1 27XBRD ANBCTR (SUTURE) IMPLANT
SUT VIC AB 2-0 SH 27 (SUTURE) ×9
SUT VIC AB 2-0 SH 27XBRD (SUTURE) ×3 IMPLANT
SUTURE FIBERWR 2-0 18 17.9 3/8 (SUTURE) IMPLANT
SUTURE TAPE 1.3 FIBERLOP 20 ST (SUTURE) ×2 IMPLANT
SUTURETAPE 1.3 FIBERLOOP 20 ST (SUTURE) ×6
SYR BULB 3OZ (MISCELLANEOUS) ×3 IMPLANT
SYR CONTROL 10ML LL (SYRINGE) ×3 IMPLANT
TOWEL GREEN STERILE FF (TOWEL DISPOSABLE) ×6 IMPLANT
TUBE CONNECTING 20'X1/4 (TUBING) ×1
TUBE CONNECTING 20X1/4 (TUBING) ×2 IMPLANT
UNDERPAD 30X30 (UNDERPADS AND DIAPERS) ×3 IMPLANT
YANKAUER SUCT BULB TIP NO VENT (SUCTIONS) ×3 IMPLANT

## 2019-03-12 NOTE — Anesthesia Postprocedure Evaluation (Signed)
Anesthesia Post Note  Patient: KEMOND AMORIN  Procedure(s) Performed: RIGHT DISTAL BICEPS TENDON REPAIR (Right Elbow)     Patient location during evaluation: PACU Anesthesia Type: General Level of consciousness: awake and alert Pain management: pain level controlled Vital Signs Assessment: post-procedure vital signs reviewed and stable Respiratory status: spontaneous breathing, nonlabored ventilation, respiratory function stable and patient connected to nasal cannula oxygen Cardiovascular status: blood pressure returned to baseline and stable Postop Assessment: no apparent nausea or vomiting Anesthetic complications: no    Last Vitals:  Vitals:   03/12/19 1530 03/12/19 1545  BP: (!) 157/73 (!) 152/70  Pulse: (!) 51 (!) 54  Resp: 16 18  Temp:    SpO2: 98% 96%    Last Pain:  Vitals:   03/12/19 1545  TempSrc:   PainSc: 5                  Orlanda Frankum L Lawayne Hartig

## 2019-03-12 NOTE — Transfer of Care (Signed)
Immediate Anesthesia Transfer of Care Note  Patient: Jason Mccall  Procedure(s) Performed: RIGHT DISTAL BICEPS TENDON REPAIR (Right Elbow)  Patient Location: PACU  Anesthesia Type:General  Level of Consciousness: sedated and responds to stimulation  Airway & Oxygen Therapy: Patient Spontanous Breathing and Patient connected to nasal cannula oxygen  Post-op Assessment: Report given to RN and Post -op Vital signs reviewed and stable  Post vital signs: Reviewed and stable  Last Vitals:  Vitals Value Taken Time  BP 143/73 03/12/19 1449  Temp    Pulse 56 03/12/19 1451  Resp 28 03/12/19 1451  SpO2 97 % 03/12/19 1451  Vitals shown include unvalidated device data.  Last Pain:  Vitals:   03/12/19 0915  TempSrc: Oral  PainSc: 2          Complications: No apparent anesthesia complications

## 2019-03-12 NOTE — Anesthesia Procedure Notes (Signed)
Procedure Name: Intubation Date/Time: 03/12/2019 1:00 PM Performed by: Freddrick March, MD Pre-anesthesia Checklist: Patient identified, Emergency Drugs available, Suction available and Patient being monitored Patient Re-evaluated:Patient Re-evaluated prior to induction Oxygen Delivery Method: Circle system utilized Preoxygenation: Pre-oxygenation with 100% oxygen Induction Type: IV induction Ventilation: Mask ventilation without difficulty Laryngoscope Size: Mac and 4 Grade View: Grade II Tube type: Oral Tube size: 7.5 mm Number of attempts: 1 Airway Equipment and Method: Stylet Placement Confirmation: ETT inserted through vocal cords under direct vision,  positive ETCO2 and breath sounds checked- equal and bilateral Secured at: 22 cm Tube secured with: Tape Dental Injury: Teeth and Oropharynx as per pre-operative assessment

## 2019-03-12 NOTE — Anesthesia Preprocedure Evaluation (Addendum)
Anesthesia Evaluation  Patient identified by MRN, date of birth, ID band Patient awake    Reviewed: Allergy & Precautions, NPO status , Patient's Chart, lab work & pertinent test results  Airway Mallampati: I  TM Distance: >3 FB Neck ROM: Full    Dental no notable dental hx. (+) Edentulous Upper, Partial Lower   Pulmonary neg pulmonary ROS, Current Smoker,    Pulmonary exam normal breath sounds clear to auscultation       Cardiovascular hypertension, Pt. on medications + Peripheral Vascular Disease  Normal cardiovascular exam Rhythm:Regular Rate:Normal     Neuro/Psych PSYCHIATRIC DISORDERS Depression negative neurological ROS     GI/Hepatic negative GI ROS, Neg liver ROS,   Endo/Other  negative endocrine ROS  Renal/GU negative Renal ROS  negative genitourinary   Musculoskeletal negative musculoskeletal ROS (+)   Abdominal   Peds  Hematology  (+) Blood dyscrasia (on plavix, last dose 03/07/19), ,   Anesthesia Other Findings   Reproductive/Obstetrics                           Anesthesia Physical Anesthesia Plan  ASA: II  Anesthesia Plan: General   Post-op Pain Management:    Induction: Intravenous  PONV Risk Score and Plan: 1 and Ondansetron, Dexamethasone and Midazolam  Airway Management Planned: Oral ETT  Additional Equipment:   Intra-op Plan:   Post-operative Plan: Extubation in OR  Informed Consent: I have reviewed the patients History and Physical, chart, labs and discussed the procedure including the risks, benefits and alternatives for the proposed anesthesia with the patient or authorized representative who has indicated his/her understanding and acceptance.     Dental advisory given  Plan Discussed with: CRNA  Anesthesia Plan Comments:         Anesthesia Quick Evaluation

## 2019-03-12 NOTE — Interval H&P Note (Signed)
History and Physical Interval Note:  03/12/2019 10:01 AM  Jason Mccall  has presented today for surgery, with the diagnosis of [place].  The various methods of treatment have been discussed with the patient and family. After consideration of risks, benefits and other options for treatment, the patient has consented to  Procedure(s): RIGHT DISTAL BICEPS TENDON REPAIR (Right) as a surgical intervention.  The patient's history has been reviewed, patient examined, no change in status, stable for surgery.  I have reviewed the patient's chart and labs.  Questions were answered to the patient's satisfaction.     Lorn Junes

## 2019-03-12 NOTE — Brief Op Note (Signed)
03/12/2019  2:45 PM  PATIENT:  Roselind Messier  58 y.o. male  PRE-OPERATIVE DIAGNOSIS:  STRAIN [place] AND [place] OF LONG HEAD OF BICEP  POST-OPERATIVE DIAGNOSIS:  STRAIN [place] AND [place] OF LONG HEAD OF BICEP  PROCEDURE:  Procedure(s): RIGHT DISTAL BICEPS TENDON REPAIR (Right)  SURGEON:  Surgeon(s) and Role:    Elsie Saas, MD - Primary  PHYSICIAN ASSISTANT: Kirstin Shepperson, PA-C, Chriss Czar, PA-C  ASSISTANTS:    ANESTHESIA:   local and general  EBL:  25 mL   BLOOD ADMINISTERED:none  DRAINS: none   LOCAL MEDICATIONS USED:  MARCAINE     SPECIMEN:  No Specimen  DISPOSITION OF SPECIMEN:  N/A  COUNTS:  YES  TOURNIQUET:   Total Tourniquet Time Documented: Upper Arm (Right) - 56 minutes Total: Upper Arm (Right) - 56 minutes   DICTATION: .Other Dictation: Dictation Number unknown  PLAN OF CARE: Discharge to home after PACU  PATIENT DISPOSITION:  PACU - hemodynamically stable.   Delay start of Pharmacological VTE agent (>24hrs) due to surgical blood loss or risk of bleeding: not applicable

## 2019-03-12 NOTE — Progress Notes (Signed)
Transferring care of patient to Marolyn Hammock., RN.

## 2019-03-12 NOTE — Discharge Instructions (Signed)
°  Post Anesthesia Home Care Instructions  Activity: Get plenty of rest for the remainder of the day. A responsible individual must stay with you for 24 hours following the procedure.  For the next 24 hours, DO NOT: -Drive a car -Paediatric nurse -Drink alcoholic beverages -Take any medication unless instructed by your physician -Make any legal decisions or sign important papers.  Meals: Start with liquid foods such as gelatin or soup. Progress to regular foods as tolerated. Avoid greasy, spicy, heavy foods. If nausea and/or vomiting occur, drink only clear liquids until the nausea and/or vomiting subsides. Call your physician if vomiting continues.  Special Instructions/Symptoms: Your throat may feel dry or sore from the anesthesia or the breathing tube placed in your throat during surgery. If this causes discomfort, gargle with warm salt water. The discomfort should disappear within 24 hours.  If you had a scopolamine patch placed behind your ear for the management of post- operative nausea and/or vomiting:  1. The medication in the patch is effective for 72 hours, after which it should be removed.  Wrap patch in a tissue and discard in the trash. Wash hands thoroughly with soap and water. 2. You may remove the patch earlier than 72 hours if you experience unpleasant side effects which may include dry mouth, dizziness or visual disturbances. 3. Avoid touching the patch. Wash your hands with soap and water after contact with the patch.    Diet: As you were doing prior to hospitalization   Activity: Increase activity slowly as tolerated  No lifting or driving for 6 weeks   Shower: Must keep splint in place until follow up, do not get splint wet   Dressing: Do not remove splint until follow up   Weight Bearing: nonweight bearing right arm.  To prevent constipation: you may use a stool softener such as -  Colace ( over the counter) 100 mg by mouth twice a day  Drink plenty of fluids  ( prune juice may be helpful) and high fiber foods  Miralax ( over the counter) for constipation as needed.   Precautions: If you experience chest pain or shortness of breath - call 911 immediately For transfer to the hospital emergency department!!  If you develop a fever greater that 101 F, purulent drainage from wound, increased redness or drainage from wound, or calf pain -- Call the office   Follow- Up Appointment: Please call for an appointment to be seen in 2 weeks or as previously scheduled [facility] - [phone]

## 2019-03-12 NOTE — Op Note (Signed)
NAME: Jason, SHELP MEDICAL RECORD EH:[id number] ACCOUNT [id number] DATE OF BIRTH:06-May-1961 FACILITY: [facility] LOCATION: [facility] PHYSICIAN:Yaretsi Humphres Venetia Maxon, MD  OPERATIVE REPORT  DATE OF PROCEDURE:  03/12/2019  PREOPERATIVE DIAGNOSES:   1.  Right elbow distal biceps acute traumatic rupture. 2.  Right biceps adhesions.  POSTOPERATIVE DIAGNOSES: 1.  Right elbow distal biceps acute traumatic rupture. 2.  Right biceps adhesions.  PROCEDURE PERFORMED: 1.  Right distal biceps repair using Arthrex TightRope anchor. 2.  Right biceps tenolysis.  SURGEON:  Elsie Saas, MD  ASSISTANT:  Matthew Saras, PA.  ANESTHESIA:  General.  OPERATIVE TIME:  One hour.  COMPLICATIONS:  None.  INDICATIONS:  The patient is a 58 year old gentleman who sustained an acute rupture of the distal biceps tendon 3-1/2 weeks ago.  He has had significant pain with exam and MRI revealing an acute rupture of the biceps tendon.  He is now to undergo primary  repair and tenolysis.  DESCRIPTION OF PROCEDURE:  The patient was brought to the operating room on 03/12/2019, placed on the operating table in supine position.  After being placed under general anesthesia, his right elbow was examined.  He had full range of motion.  Elbow was  stable with obvious distal biceps tendon rupture.  The right arm was prepped using sterile DuraPrep and draped using sterile technique.  Time-out procedure was called and the correct right arm identified.  The right arm was exsanguinated and tourniquet  elevated to 250 mm.  Initially through a 4-5 cm L-shaped incision in the antecubital fossa, initial exposure was made.  The underlying subcutaneous tissues were incised in line with the skin incision.  Neurovascular structures carefully protected while  the distal biceps tendon was identified.  It was significantly retracted  8-10 cm up into the distal upper arm.  It was carefully brought out into the wound and a very careful  tenolysis was carried out in order to regain the normal amount of length on  this biceps musculotendinous junction.  A #2 FiberWire, locking, running mattress suture was then placed in the distal biceps tendon.  At this point, careful dissection was carried out down to the bicipital tuberosity of the proximal radius.   Neurovascular structures carefully protected.  Intraoperative fluoroscopy confirmed the position of the dissection and then a pin from the Arthrex set was drilled into the bicipital tuberosity of the radius and confirmed with fluoroscopy and then  overdrilled with a 7 mm drill on the proximal cortex.  After this was done, then the biceps tendon was delivered down to the tunnel made in the tuberosity and then the Arthrex TightRope anchor was deployed on the far cortex and then sequentially with the  elbow at 70 degrees of flexion, the tendon was advanced into the tunnel and then it was sutured on itself to lock it in place.  Intraoperative fluoroscopy confirmed excellent position of the TightRope button.  The elbow could be brought to approximately   minus 30-40 degrees before any excessive tension was noted on the repair.  At this point, the wound was irrigated, tourniquet was released.  Small venous bleeders were cauterized, but no excessive bleeding was noted and then the wound was irrigated and  then closed with 2-0 Vicryl in the subcutaneous tissues and  4-0 Monocryl.  Sterile dressings were applied and a long arm splint.  The patient was then awakened and taken to recovery room in stable condition.  Needle and sponge count was correct x2 at  the end of the case.  FOLLOWUP CARE:  The patient be followed as an outpatient on oxycodone and tizanidine.  I will see him back in the office in a week for wound check and followup.  AN/NUANCE  D:03/12/2019 T:03/12/2019 JOB:[id number]

## 2019-03-13 ENCOUNTER — Encounter (HOSPITAL_BASED_OUTPATIENT_CLINIC_OR_DEPARTMENT_OTHER): Payer: Self-pay | Admitting: Orthopedic Surgery

## 2019-03-13 MED ORDER — IOPAMIDOL (ISOVUE-370) INJECTION 76%
50.00 | INTRAVENOUS | Status: DC
Start: ? — End: 2019-03-13

## 2019-03-13 MED ORDER — DOCUSATE SODIUM 100 MG PO CAPS
100.00 | ORAL_CAPSULE | ORAL | Status: DC
Start: 2019-03-13 — End: 2019-03-13

## 2019-03-13 MED ORDER — BUPROPION HCL ER (XL) 150 MG PO TB24
300.00 | ORAL_TABLET | ORAL | Status: DC
Start: 2019-03-14 — End: 2019-03-13

## 2019-03-13 MED ORDER — CITALOPRAM HYDROBROMIDE 20 MG PO TABS
40.00 | ORAL_TABLET | ORAL | Status: DC
Start: 2019-03-14 — End: 2019-03-13

## 2019-03-13 MED ORDER — LABETALOL HCL 5 MG/ML IV SOLN
10.00 | INTRAVENOUS | Status: DC
Start: ? — End: 2019-03-13

## 2019-03-13 MED ORDER — ONDANSETRON HCL 4 MG/2ML IJ SOLN
4.00 | INTRAMUSCULAR | Status: DC
Start: ? — End: 2019-03-13

## 2019-03-13 MED ORDER — OXYCODONE HCL 5 MG PO TABS
5.00 | ORAL_TABLET | ORAL | Status: DC
Start: ? — End: 2019-03-13

## 2019-03-13 MED ORDER — GENERIC EXTERNAL MEDICATION
Status: DC
Start: ? — End: 2019-03-13

## 2019-03-13 MED ORDER — HYDRALAZINE HCL 20 MG/ML IJ SOLN
10.00 | INTRAMUSCULAR | Status: DC
Start: ? — End: 2019-03-13

## 2019-03-13 MED ORDER — EPTIFIBATIDE 75 MG/100ML IV SOLN
1.00 | INTRAVENOUS | Status: DC
Start: ? — End: 2019-03-13

## 2019-03-13 MED ORDER — PRAMIPEXOLE DIHYDROCHLORIDE 0.25 MG PO TABS
.25 | ORAL_TABLET | ORAL | Status: DC
Start: 2019-03-13 — End: 2019-03-13

## 2019-08-22 ENCOUNTER — Other Ambulatory Visit: Payer: Self-pay

## 2019-08-22 ENCOUNTER — Emergency Department (HOSPITAL_COMMUNITY): Payer: Managed Care, Other (non HMO)

## 2019-08-22 ENCOUNTER — Encounter (HOSPITAL_COMMUNITY): Payer: Self-pay | Admitting: Pharmacy Technician

## 2019-08-22 ENCOUNTER — Emergency Department (HOSPITAL_COMMUNITY)
Admission: EM | Admit: 2019-08-22 | Discharge: 2019-08-22 | Disposition: A | Discharge status: Home / Self Care | Payer: Managed Care, Other (non HMO) | Attending: Emergency Medicine | Admitting: Emergency Medicine

## 2019-08-22 DIAGNOSIS — S0091XA Abrasion of unspecified part of head, initial encounter: Secondary | ICD-10-CM | POA: Diagnosis not present

## 2019-08-22 DIAGNOSIS — Y929 Unspecified place or not applicable: Secondary | ICD-10-CM | POA: Diagnosis not present

## 2019-08-22 DIAGNOSIS — S0990XA Unspecified injury of head, initial encounter: Secondary | ICD-10-CM | POA: Insufficient documentation

## 2019-08-22 DIAGNOSIS — T07XXXA Unspecified multiple injuries, initial encounter: Secondary | ICD-10-CM

## 2019-08-22 DIAGNOSIS — I10 Essential (primary) hypertension: Secondary | ICD-10-CM | POA: Insufficient documentation

## 2019-08-22 DIAGNOSIS — Z7901 Long term (current) use of anticoagulants: Secondary | ICD-10-CM | POA: Diagnosis not present

## 2019-08-22 DIAGNOSIS — F17210 Nicotine dependence, cigarettes, uncomplicated: Secondary | ICD-10-CM | POA: Insufficient documentation

## 2019-08-22 DIAGNOSIS — Y939 Activity, unspecified: Secondary | ICD-10-CM | POA: Insufficient documentation

## 2019-08-22 DIAGNOSIS — S61412A Laceration without foreign body of left hand, initial encounter: Secondary | ICD-10-CM | POA: Diagnosis not present

## 2019-08-22 DIAGNOSIS — I739 Peripheral vascular disease, unspecified: Secondary | ICD-10-CM | POA: Diagnosis not present

## 2019-08-22 DIAGNOSIS — Y999 Unspecified external cause status: Secondary | ICD-10-CM | POA: Diagnosis not present

## 2019-08-22 DIAGNOSIS — Z7982 Long term (current) use of aspirin: Secondary | ICD-10-CM | POA: Insufficient documentation

## 2019-08-22 DIAGNOSIS — R519 Headache, unspecified: Secondary | ICD-10-CM | POA: Insufficient documentation

## 2019-08-22 DIAGNOSIS — Z79899 Other long term (current) drug therapy: Secondary | ICD-10-CM | POA: Insufficient documentation

## 2019-08-22 DIAGNOSIS — I2510 Atherosclerotic heart disease of native coronary artery without angina pectoris: Secondary | ICD-10-CM | POA: Diagnosis not present

## 2019-08-22 DIAGNOSIS — S61411A Laceration without foreign body of right hand, initial encounter: Secondary | ICD-10-CM | POA: Diagnosis not present

## 2019-08-22 MED ORDER — LIDOCAINE HCL (PF) 1 % IJ SOLN
30.0000 mL | Freq: Once | INTRAMUSCULAR | Status: AC
  Administered 2019-08-22: 30 mL
  Filled 2019-08-22: qty 30

## 2019-08-22 MED ORDER — HYDROCODONE-ACETAMINOPHEN 5-325 MG PO TABS: 1.0000 | ORAL_TABLET | ORAL | 0 refills | Status: DC | PRN

## 2019-08-22 NOTE — ED Notes (Signed)
Patient Alert and oriented to baseline. Stable and ambulatory to baseline. Patient verbalized understanding of the discharge instructions.  Patient belongings were taken by the patient.   

## 2019-08-22 NOTE — ED Notes (Signed)
Pt ambulatory to the restroom. Gait steady and even.  

## 2019-08-22 NOTE — ED Notes (Signed)
Patient transported to CT 
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
## 2019-08-22 NOTE — ED Notes (Signed)
Bandages applied to bil hands.

## 2019-10-30 ENCOUNTER — Other Ambulatory Visit: Payer: Self-pay

## 2019-10-30 DIAGNOSIS — I70209 Unspecified atherosclerosis of native arteries of extremities, unspecified extremity: Secondary | ICD-10-CM

## 2019-10-31 ENCOUNTER — Ambulatory Visit (INDEPENDENT_AMBULATORY_CARE_PROVIDER_SITE_OTHER)
Admission: RE | Admit: 2019-10-31 | Discharge: 2019-10-31 | Disposition: A | Discharge status: Home / Self Care | Payer: Managed Care, Other (non HMO) | Source: Ambulatory Visit | Attending: Vascular Surgery | Admitting: Vascular Surgery

## 2019-10-31 ENCOUNTER — Encounter: Payer: Self-pay | Admitting: Vascular Surgery

## 2019-10-31 ENCOUNTER — Ambulatory Visit (HOSPITAL_COMMUNITY)
Admission: RE | Admit: 2019-10-31 | Discharge: 2019-10-31 | Disposition: A | Discharge status: Home / Self Care | Payer: Managed Care, Other (non HMO) | Source: Ambulatory Visit | Attending: Vascular Surgery | Admitting: Vascular Surgery

## 2019-10-31 ENCOUNTER — Ambulatory Visit: Payer: Managed Care, Other (non HMO) | Admitting: Vascular Surgery

## 2019-10-31 ENCOUNTER — Other Ambulatory Visit: Payer: Self-pay

## 2019-10-31 VITALS — BP 130/61 | HR 55 | Temp 97.9°F | Resp 20 | Ht 70.0 in | Wt 193.0 lb

## 2019-10-31 DIAGNOSIS — I70209 Unspecified atherosclerosis of native arteries of extremities, unspecified extremity: Secondary | ICD-10-CM

## 2019-10-31 MED ORDER — CLOPIDOGREL BISULFATE 75 MG PO TABS: 75.0000 mg | ORAL_TABLET | Freq: Every day | ORAL | 11 refills | Status: DC
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
## 2019-11-02 ENCOUNTER — Other Ambulatory Visit: Payer: Self-pay | Admitting: *Deleted

## 2019-11-02 DIAGNOSIS — I70209 Unspecified atherosclerosis of native arteries of extremities, unspecified extremity: Secondary | ICD-10-CM
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
## 2020-11-18 ENCOUNTER — Other Ambulatory Visit: Payer: Self-pay | Admitting: Vascular Surgery

## 2021-10-13 ENCOUNTER — Other Ambulatory Visit: Payer: Self-pay | Admitting: Vascular Surgery

## 2022-02-03 ENCOUNTER — Other Ambulatory Visit: Payer: Self-pay | Admitting: *Deleted

## 2022-02-03 DIAGNOSIS — I70219 Atherosclerosis of native arteries of extremities with intermittent claudication, unspecified extremity: Secondary | ICD-10-CM

## 2022-02-03 DIAGNOSIS — I739 Peripheral vascular disease, unspecified: Secondary | ICD-10-CM

## 2022-02-11 ENCOUNTER — Ambulatory Visit: Payer: Managed Care, Other (non HMO) | Admitting: Vascular Surgery

## 2022-02-11 ENCOUNTER — Ambulatory Visit (INDEPENDENT_AMBULATORY_CARE_PROVIDER_SITE_OTHER)
Admission: RE | Admit: 2022-02-11 | Discharge: 2022-02-11 | Disposition: A | Discharge status: Home / Self Care | Payer: Managed Care, Other (non HMO) | Source: Ambulatory Visit | Attending: Vascular Surgery | Admitting: Vascular Surgery

## 2022-02-11 ENCOUNTER — Ambulatory Visit (HOSPITAL_COMMUNITY)
Admission: RE | Admit: 2022-02-11 | Discharge: 2022-02-11 | Disposition: A | Discharge status: Home / Self Care | Payer: Managed Care, Other (non HMO) | Source: Ambulatory Visit | Attending: Vascular Surgery | Admitting: Vascular Surgery

## 2022-02-11 ENCOUNTER — Encounter: Payer: Self-pay | Admitting: Vascular Surgery

## 2022-02-11 VITALS — BP 180/63 | HR 50 | Temp 98.0°F | Resp 20 | Ht 70.0 in | Wt 198.0 lb

## 2022-02-11 DIAGNOSIS — I70219 Atherosclerosis of native arteries of extremities with intermittent claudication, unspecified extremity: Secondary | ICD-10-CM

## 2022-02-11 DIAGNOSIS — I739 Peripheral vascular disease, unspecified: Secondary | ICD-10-CM

## 2022-02-11 NOTE — Progress Notes (Signed)
REASON FOR VISIT:   Follow-up of peripheral arterial disease  MEDICAL ISSUES:   PERIPHERAL ARTERIAL DISEASE: This patient is undergone right common iliac artery angioplasty and stenting and left common iliac artery angioplasty and stenting.  We have been following some moderate recurrent stenosis.  He has a greater than 50% recurrent stenosis bilaterally.  However he has palpable pedal pulses with normal ABIs and toe pressures.  I have simply encouraged him to stay as active as possible.  We have discussed the importance of tobacco cessation.  I have ordered follow-up aortoiliac duplex and ABIs in 1 year and I will see him back at that time.  He knows to call sooner if he has problems.  He is on aspirin, Plavix, and a statin.  HPI:   Jason Mccall is a pleasant 61 y.o. male returns for follow-up of his peripheral arterial disease.  I last saw him 2 and half years ago.  He had a right common iliac artery stent in the past.  In 2017 he developed recurrent stenosis and underwent angioplasty and stenting of the recurrent stenosis on the right and also angioplasty and stenting of the left common iliac artery.  Back then he had some mild recurrent stenosis bilaterally but had palpable femoral and posterior tibial pulses with ABIs 100% bilaterally.  He was asymptomatic.  I set him up for a 59-month follow-up visit.  He was on aspirin, Plavix, and a statin.  Since I saw him last, he remains very active.  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  I do not get any clear-cut history of claudication.  He denies any history of rest pain or nonhealing ulcers.  He does continue to smoke 1 pack/day.  He is on aspirin, Plavix, and statin.  Past Medical History:  Diagnosis Date   BPH (benign prostatic hypertrophy)    Cataract    Right Eye   Depression    ED (erectile dysfunction)    Hyperlipidemia    Hypertension    Peripheral vascular disease (HCC)    Restless leg syndrome    Stroke  [facility])    Traumatic rupture of right distal biceps tendon 03/06/2019    Family History  Problem Relation Age of Onset   Heart disease Mother    Hyperlipidemia Mother    Hypertension Mother    Heart disease Father    Hyperlipidemia Father    Hypertension Father    Heart attack Father    Brain cancer Brother 35   Cancer Brother    Hyperlipidemia Brother    Bleeding Disorder Brother    Cancer Sister    Hyperlipidemia Sister    Hyperlipidemia Sister     SOCIAL HISTORY: Social History   Tobacco Use   Smoking status: Every Day    Packs/day: 1.00    Years: 30.00    Total pack years: 30.00    Types: Cigarettes   Smokeless tobacco: Never  Substance Use Topics   Alcohol use: Yes    Comment: occasional    No Known Allergies  Current Outpatient Medications  Medication Sig Dispense Refill   aspirin EC 325 MG tablet Take 325 mg by mouth daily.     atorvastatin (LIPITOR) 40 MG tablet Take 40 mg by mouth at bedtime.     buPROPion (WELLBUTRIN XL) 300 MG 24 hr tablet Take 300 mg by mouth daily.  1   citalopram (CELEXA) 40 MG tablet Take 40 mg by mouth daily.  clopidogrel (PLAVIX) 75 MG tablet TAKE ONE TABLET BY MOUTH DAILY 30 tablet 11   losartan-hydrochlorothiazide (HYZAAR) 100-12.5 MG tablet Take 1 tablet by mouth daily.   3   pramipexole (MIRAPEX) 0.25 MG tablet Take 0.25 mg by mouth every evening. Reported on 01/14/2016     tadalafil (CIALIS) 10 MG tablet Take 10 mg by mouth daily as needed for erectile dysfunction.     tiZANidine (ZANAFLEX) 4 MG capsule Take 1 capsule (4 mg total) by mouth 3 (three) times daily as needed for muscle spasms. 40 capsule 0   No current facility-administered medications for this visit.    REVIEW OF SYSTEMS:  [X]  denotes positive finding, [ ]  denotes negative finding Cardiac  Comments:  Chest pain or chest pressure:    Shortness of breath upon exertion:    Short of breath when lying flat:    Irregular heart rhythm:        Vascular    Pain  in calf, thigh, or hip brought on by ambulation:    Pain in feet at night that wakes you up from your sleep:     Blood clot in your veins:    Leg swelling:         Pulmonary    Oxygen at home:    Productive cough:     Wheezing:         Neurologic    Sudden weakness in arms or legs:     Sudden numbness in arms or legs:     Sudden onset of difficulty speaking or slurred speech:    Temporary loss of vision in one eye:     Problems with dizziness:         Gastrointestinal    Blood in stool:     Vomited blood:         Genitourinary    Burning when urinating:     Blood in urine:        Psychiatric    Major depression:         Hematologic    Bleeding problems:    Problems with blood clotting too easily:        Skin    Rashes or ulcers:        Constitutional    Fever or chills:     PHYSICAL EXAM:   Vitals:   02/11/22 0912  BP: (!) 180/63  Pulse: (!) 50  Resp: 20  Temp: 98 F (36.7 C)  SpO2: 96%  Weight: 198 lb (89.8 kg)  Height: 5\' 10"  (1.778 m)    GENERAL: The patient is a well-nourished male, in no acute distress. The vital signs are documented above. CARDIAC: There is a regular rate and rhythm.  VASCULAR: I do not detect carotid bruits. He has palpable femoral, dorsalis pedis, and posterior tibial pulses bilaterally. He has no significant lower extremity swelling. PULMONARY: There is good air exchange bilaterally without wheezing or rales. ABDOMEN: Soft and non-tender with normal pitched bowel sounds.  MUSCULOSKELETAL: There are no major deformities or cyanosis. NEUROLOGIC: No focal weakness or paresthesias are detected. SKIN: There are no ulcers or rashes noted. PSYCHIATRIC: The patient has a normal affect.  DATA:    AORTOILIAC DUPLEX: I have independently interpreted his aortoiliac duplex scan today.  On the right side there is a greater than 50% right common iliac artery stenosis.  Peak systolic velocity is 123456 cm/s.  There is biphasic flow in this  area.  On the left side visualization was somewhat  limited.  However there was suggestion of a greater than 50% stenosis in the left common iliac artery.  ARTERIAL DOPPLER STUDY: I have independently interpreted his arterial Doppler study today.  On the right side there is a triphasic posterior tibial and dorsalis pedis signal.  ABIs 100%.  Toe pressure 78 mmHg.  On the left side there is a triphasic posterior tibial and dorsalis pedis signal.  ABIs 100%.  Toe pressures 110 mmHg.  Deitra Mayo [facility] [phone]

## 2022-10-15 ENCOUNTER — Other Ambulatory Visit: Payer: Self-pay | Admitting: Vascular Surgery

## 2023-03-11 ENCOUNTER — Other Ambulatory Visit: Payer: Self-pay | Admitting: *Deleted

## 2023-03-11 DIAGNOSIS — I70219 Atherosclerosis of native arteries of extremities with intermittent claudication, unspecified extremity: Secondary | ICD-10-CM

## 2023-03-11 DIAGNOSIS — I739 Peripheral vascular disease, unspecified: Secondary | ICD-10-CM

## 2023-03-24 ENCOUNTER — Ambulatory Visit: Payer: Managed Care, Other (non HMO) | Admitting: Vascular Surgery

## 2023-03-24 ENCOUNTER — Ambulatory Visit (INDEPENDENT_AMBULATORY_CARE_PROVIDER_SITE_OTHER)
Admission: RE | Admit: 2023-03-24 | Discharge: 2023-03-24 | Discharge status: Home / Self Care | Payer: Managed Care, Other (non HMO) | Source: Ambulatory Visit | Attending: Vascular Surgery | Admitting: Vascular Surgery

## 2023-03-24 ENCOUNTER — Encounter: Payer: Self-pay | Admitting: Vascular Surgery

## 2023-03-24 ENCOUNTER — Ambulatory Visit (HOSPITAL_COMMUNITY)
Admission: RE | Admit: 2023-03-24 | Discharge: 2023-03-24 | Disposition: A | Discharge status: Home / Self Care | Payer: Managed Care, Other (non HMO) | Source: Ambulatory Visit | Attending: Vascular Surgery | Admitting: Vascular Surgery

## 2023-03-24 VITALS — BP 156/71 | HR 54 | Temp 97.9°F | Resp 18 | Ht 70.5 in | Wt 196.0 lb

## 2023-03-24 DIAGNOSIS — I70219 Atherosclerosis of native arteries of extremities with intermittent claudication, unspecified extremity: Secondary | ICD-10-CM | POA: Diagnosis not present

## 2023-03-24 DIAGNOSIS — I739 Peripheral vascular disease, unspecified: Secondary | ICD-10-CM | POA: Insufficient documentation

## 2023-03-24 LAB — VAS US ABI WITH/WO TBI
Left ABI: 1.15
Right ABI: 1.09

## 2023-03-24 NOTE — Progress Notes (Signed)
REASON FOR VISIT:   Follow-up of peripheral arterial disease.  MEDICAL ISSUES:   PERIPHERAL ARTERIAL DISEASE: This patient is undergone bilateral common iliac artery stents.  He has some mild recurrent stenosis on both sides but has palpable pedal pulses and normal ABIs.  The peak systolic velocities on both sides have actually improved some.  I think a 1 year follow-up visit is appropriate.  I explained that I will be retiring and so he will be seen on the PA schedule at that time.  I have ordered an aortoiliac duplex and ABIs at that time.  He is to call sooner if he has problems.  We have again discussed the importance of tobacco cessation.  He is on aspirin, Plavix, and a statin.  HPI:   Jason Mccall is a pleasant 62 y.o. male who I last saw on 02/11/2022.  He had undergone a right common iliac artery stent in the past.  In 2017 he developed a recurrent stenosis and underwent angioplasty and stenting of his recurrent right common iliac artery stenosis and also angioplasty and stenting of the left common iliac artery.  We have been following some moderate recurrent disease.  When I saw him last he had palpable pedal pulses and normal ABIs and toe pressures.  I encouraged him to stay as active as possible.  We discussed the importance of tobacco cessation.  I have ordered an aortoiliac duplex and ABIs in 1 year when he comes in for that visit.  He was on aspirin, Plavix, and a statin.  Since I saw him last, he denies any claudication, rest pain, or nonhealing ulcers.  There have been no significant changes to his medical history.  He continues to smoke a pack per day of cigarettes.  Past Medical History:  Diagnosis Date   BPH (benign prostatic hypertrophy)    Cataract    Right Eye   Depression    ED (erectile dysfunction)    Hyperlipidemia    Hypertension    Peripheral vascular disease (HCC)    Restless leg syndrome    Stroke [facility])    Traumatic rupture of right distal biceps  tendon 03/06/2019    Family History  Problem Relation Age of Onset   Heart disease Mother    Hyperlipidemia Mother    Hypertension Mother    Heart disease Father    Hyperlipidemia Father    Hypertension Father    Heart attack Father    Brain cancer Brother 48   Cancer Brother    Hyperlipidemia Brother    Bleeding Disorder Brother    Cancer Sister    Hyperlipidemia Sister    Hyperlipidemia Sister     SOCIAL HISTORY: Social History   Tobacco Use   Smoking status: Every Day    Current packs/day: 1.00    Average packs/day: 1 pack/day for 30.0 years (30.0 ttl pk-yrs)    Types: Cigarettes   Smokeless tobacco: Never  Substance Use Topics   Alcohol use: Yes    Comment: occasional    No Known Allergies  Current Outpatient Medications  Medication Sig Dispense Refill   aspirin EC 325 MG tablet Take 325 mg by mouth daily.     atorvastatin (LIPITOR) 40 MG tablet Take 40 mg by mouth at bedtime.     buPROPion (WELLBUTRIN XL) 300 MG 24 hr tablet Take 300 mg by mouth daily.  1   citalopram (CELEXA) 40 MG tablet Take 40 mg by mouth daily.  clopidogrel (PLAVIX) 75 MG tablet TAKE ONE TABLET BY MOUTH DAILY 30 tablet 5   pramipexole (MIRAPEX) 0.25 MG tablet Take 0.25 mg by mouth every evening. Reported on 01/14/2016     tiZANidine (ZANAFLEX) 4 MG capsule Take 1 capsule (4 mg total) by mouth 3 (three) times daily as needed for muscle spasms. 40 capsule 0   losartan-hydrochlorothiazide (HYZAAR) 100-12.5 MG tablet Take 1 tablet by mouth daily.  (Patient not taking: Reported on 03/24/2023)  3   tadalafil (CIALIS) 10 MG tablet Take 10 mg by mouth daily as needed for erectile dysfunction. (Patient not taking: Reported on 03/24/2023)     No current facility-administered medications for this visit.    REVIEW OF SYSTEMS:  [X]  denotes positive finding, [ ]  denotes negative finding Cardiac  Comments:  Chest pain or chest pressure:    Shortness of breath upon exertion:    Short of breath when  lying flat:    Irregular heart rhythm:        Vascular    Pain in calf, thigh, or hip brought on by ambulation:    Pain in feet at night that wakes you up from your sleep:     Blood clot in your veins:    Leg swelling:         Pulmonary    Oxygen at home:    Productive cough:     Wheezing:         Neurologic    Sudden weakness in arms or legs:     Sudden numbness in arms or legs:     Sudden onset of difficulty speaking or slurred speech:    Temporary loss of vision in one eye:     Problems with dizziness:         Gastrointestinal    Blood in stool:     Vomited blood:         Genitourinary    Burning when urinating:     Blood in urine:        Psychiatric    Major depression:         Hematologic    Bleeding problems:    Problems with blood clotting too easily:        Skin    Rashes or ulcers:        Constitutional    Fever or chills:     PHYSICAL EXAM:   Vitals:   03/24/23 1025  BP: (!) 156/71  Pulse: (!) 54  Resp: 18  Temp: 97.9 F (36.6 C)  TempSrc: Temporal  SpO2: 95%  Weight: 196 lb (88.9 kg)  Height: 5' 10.5" (1.791 m)    GENERAL: The patient is a well-nourished male, in no acute distress. The vital signs are documented above. CARDIAC: There is a regular rate and rhythm.  VASCULAR: I do not detect carotid bruits. He has palpable femoral pulses and palpable posterior tibial pulses bilaterally. He has no significant lower extremity swelling. PULMONARY: There is good air exchange bilaterally without wheezing or rales. ABDOMEN: Soft and non-tender with normal pitched bowel sounds.  MUSCULOSKELETAL: There are no major deformities or cyanosis. NEUROLOGIC: No focal weakness or paresthesias are detected. SKIN: There are no ulcers or rashes noted. PSYCHIATRIC: The patient has a normal affect.  DATA:    ARTERIAL DOPPLER STUDY: I have independently interpreted his arterial Doppler study today.  On the right side there is a biphasic posterior tibial and  dorsalis pedis signal.  ABIs 100%.  Toe pressure 68 mmHg.  On the left side there is a biphasic posterior tibial and dorsalis pedis signal.  ABIs 100%.  Toe pressures of 97 mmHg.  ARTERIAL DUPLEX: I have independently interpreted his aortoiliac duplex.  On the right side the common iliac artery stent is patent.  There is a greater than 50% recurrent stenosis in the mid and distal stent.  Peak systolic velocity is 279 cm/s.  Previous peak systolic velocity on the right was 345 cm/s.  There is biphasic flow throughout the stent.  On the left side, there is a recurrent greater than 50% stenosis in the proximal mid and distal stent.  Peak systolic velocity is 308 cm/s.  Previous peak systolic velocity in the left was 359 cm/s.  There is biphasic flow throughout the stent.    Waverly Ferrari [facility] [phone]

## 2023-03-30 ENCOUNTER — Other Ambulatory Visit: Payer: Self-pay

## 2023-03-30 DIAGNOSIS — I70219 Atherosclerosis of native arteries of extremities with intermittent claudication, unspecified extremity: Secondary | ICD-10-CM

## 2023-03-30 DIAGNOSIS — I739 Peripheral vascular disease, unspecified: Secondary | ICD-10-CM

## 2023-04-13 ENCOUNTER — Other Ambulatory Visit: Payer: Self-pay | Admitting: Vascular Surgery

## 2024-04-10 ENCOUNTER — Ambulatory Visit

## 2024-04-10 ENCOUNTER — Encounter (HOSPITAL_COMMUNITY)

## 2024-04-17 ENCOUNTER — Encounter (HOSPITAL_COMMUNITY)

## 2024-04-17 ENCOUNTER — Ambulatory Visit

## 2024-05-15 ENCOUNTER — Other Ambulatory Visit: Payer: Self-pay

## 2024-05-15 DIAGNOSIS — I739 Peripheral vascular disease, unspecified: Secondary | ICD-10-CM

## 2024-05-15 DIAGNOSIS — I70219 Atherosclerosis of native arteries of extremities with intermittent claudication, unspecified extremity: Secondary | ICD-10-CM

## 2024-06-14 ENCOUNTER — Ambulatory Visit (HOSPITAL_COMMUNITY)
Admission: RE | Admit: 2024-06-14 | Discharge: 2024-06-14 | Disposition: A | Discharge status: Home / Self Care | Source: Ambulatory Visit | Attending: Vascular Surgery | Admitting: Vascular Surgery

## 2024-06-14 ENCOUNTER — Encounter: Payer: Self-pay | Admitting: Physician Assistant

## 2024-06-14 ENCOUNTER — Ambulatory Visit: Attending: Vascular Surgery | Admitting: Physician Assistant

## 2024-06-14 ENCOUNTER — Ambulatory Visit (HOSPITAL_BASED_OUTPATIENT_CLINIC_OR_DEPARTMENT_OTHER)
Admission: RE | Admit: 2024-06-14 | Discharge: 2024-06-14 | Disposition: A | Discharge status: Home / Self Care | Source: Ambulatory Visit | Attending: Vascular Surgery | Admitting: Vascular Surgery

## 2024-06-14 VITALS — BP 170/63 | HR 56 | Temp 98.0°F

## 2024-06-14 DIAGNOSIS — T82856A Stenosis of peripheral vascular stent, initial encounter: Secondary | ICD-10-CM | POA: Diagnosis not present

## 2024-06-14 DIAGNOSIS — I739 Peripheral vascular disease, unspecified: Secondary | ICD-10-CM | POA: Insufficient documentation

## 2024-06-14 DIAGNOSIS — I70219 Atherosclerosis of native arteries of extremities with intermittent claudication, unspecified extremity: Secondary | ICD-10-CM | POA: Diagnosis not present

## 2024-06-14 LAB — VAS US ABI WITH/WO TBI
Left ABI: 1.19
Right ABI: 1.15

## 2024-06-14 NOTE — Progress Notes (Signed)
 Office Note   History of Present Illness   Jason Mccall is a 63 y.o. (Jan 11, 1961) male who presents for surveillance of PAD.  He has a history of bilateral common iliac artery stenting.  He was previously seen by Dr. Eliza for surveillance, and it was noted that he had mild asymptomatic stenosis within both of his stents.  He returns today for follow-up.  He has no complaints at today's office visit.  He denies any claudication, rest pain, or tissue loss.  He says that his PCP stopped his aspirin because he did not need it.  He still takes his Plavix  daily.  He is a current smoker.  Current Outpatient Medications  Medication Sig Dispense Refill   aspirin EC 325 MG tablet Take 325 mg by mouth daily. (Patient not taking: Reported on 06/14/2024)     atorvastatin (LIPITOR) 40 MG tablet Take 40 mg by mouth at bedtime.     buPROPion  (WELLBUTRIN  XL) 300 MG 24 hr tablet Take 300 mg by mouth daily.  1   citalopram  (CELEXA ) 40 MG tablet Take 40 mg by mouth daily.     clopidogrel  (PLAVIX ) 75 MG tablet TAKE 1 TABLET BY MOUTH DAILY 90 tablet 3   losartan-hydrochlorothiazide (HYZAAR) 100-12.5 MG tablet Take 1 tablet by mouth daily.  (Patient not taking: Reported on 03/24/2023)  3   pramipexole  (MIRAPEX ) 0.25 MG tablet Take 0.25 mg by mouth every evening. Reported on 01/14/2016     tadalafil (CIALIS) 10 MG tablet Take 10 mg by mouth daily as needed for erectile dysfunction. (Patient not taking: Reported on 03/24/2023)     tiZANidine  (ZANAFLEX ) 4 MG capsule Take 1 capsule (4 mg total) by mouth 3 (three) times daily as needed for muscle spasms. 40 capsule 0   No current facility-administered medications for this visit.    REVIEW OF SYSTEMS (negative unless checked):   Cardiac:  []  Chest pain or chest pressure? []  Shortness of breath upon activity? []  Shortness of breath when lying flat? []  Irregular heart rhythm?  Vascular:  []  Pain in calf, thigh, or hip brought on by walking? []  Pain in  feet at night that wakes you up from your sleep? []  Blood clot in your veins? []  Leg swelling?  Pulmonary:  []  Oxygen at home? []  Productive cough? []  Wheezing?  Neurologic:  []  Sudden weakness in arms or legs? []  Sudden numbness in arms or legs? []  Sudden onset of difficult speaking or slurred speech? []  Temporary loss of vision in one eye? []  Problems with dizziness?  Gastrointestinal:  []  Blood in stool? []  Vomited blood?  Genitourinary:  []  Burning when urinating? []  Blood in urine?  Psychiatric:  []  Major depression  Hematologic:  []  Bleeding problems? []  Problems with blood clotting?  Dermatologic:  []  Rashes or ulcers?  Constitutional:  []  Fever or chills?  Ear/Nose/Throat:  []  Change in hearing? []  Nose bleeds? []  Sore throat?  Musculoskeletal:  []  Back pain? []  Joint pain? []  Muscle pain?   Physical Examination   Vitals:   06/14/24 1013  BP: (!) 170/63  Pulse: (!) 56  Temp: 98 F (36.7 C)  TempSrc: Temporal   There is no height or weight on file to calculate BMI.  General:  WDWN in NAD; vital signs documented above Gait: Not observed HENT: WNL, normocephalic Pulmonary: normal non-labored breathing , without rales, rhonchi,  wheezing Cardiac: Regular Abdomen: soft, NT, no masses Skin: without rashes Vascular Exam/Pulses: 1+ femoral pulses bilaterally.  Palpable PT  pulses bilaterally Extremities: without ischemic changes, without gangrene , without cellulitis; without open wounds;  Musculoskeletal: no muscle wasting or atrophy  Neurologic: A&O X 3;  No focal weakness or paresthesias are detected Psychiatric:  The pt has Normal affect.  Non-Invasive Vascular imaging   ABI (06/14/2024) R:  ABI: 1.15 (1.09),  PT: tri DP: tri TBI: 0.46 L:  ABI: 1.19 (1.15),  PT: tri DP: tri TBI: 0.77  Aortoiliac Duplex (06/14/2024) Patent bilateral common iliac artery stents.  50 to 99% stenosis of the right common iliac artery stent, with a PSV  of 487 cm/s.  50 to 99% stenosis of the left common iliac artery stent, with a PSV of 356 cm/s.   Medical Decision Making   Jason Mccall is a 63 y.o. male who presents for surveillance of PAD  Based on the patient's vascular studies, his ABIs are essentially unchanged bilaterally.  His right ABI is 1.15 and left ABI is 1.19 Arterial duplex demonstrates patent bilateral common iliac artery stents.  There appears to be worsening stenosis within both stents.  He has 50 to 99% stenosis of both stents, with a PSV of 487 cm/s on the right and a PSV of 356 cm/s on the left He has no complaints at today's office visit.  He denies any claudication, rest pain, or tissue loss.  He is taking a statin and Plavix  daily.  He is no longer taking aspirin. On exam he has 1+ femoral pulses bilaterally.  He has palpable PT pulses bilaterally.  He has no tissue loss on exam I have explained to the patient that he would benefit from repeat abdominal aortogram with investigation of both of his common iliac artery stents.  He appears to have moderate to high grade stenosis within both of the stents, worse on the right.  He may require balloon angioplasty of his stents or repeat stenting.  I have explained that this is important to do to prevent his stents from occluding.  He is agreeable for angiogram. We will schedule the patient for abdominal aortogram with possible intervention on bilateral common iliac artery stents, with Dr. Pearline, within the next couple of weeks.   Ahmed Holster PA-C [facility] [place] Office: [phone]  Clinic MD: [place]

## 2024-06-14 NOTE — H&P (View-Only) (Signed)
 Office Note   History of Present Illness   Jason Mccall is a 63 y.o. (Jan 11, 1961) male who presents for surveillance of PAD.  He has a history of bilateral common iliac artery stenting.  He was previously seen by Dr. Eliza for surveillance, and it was noted that he had mild asymptomatic stenosis within both of his stents.  He returns today for follow-up.  He has no complaints at today's office visit.  He denies any claudication, rest pain, or tissue loss.  He says that his PCP stopped his aspirin because he did not need it.  He still takes his Plavix  daily.  He is a current smoker.  Current Outpatient Medications  Medication Sig Dispense Refill   aspirin EC 325 MG tablet Take 325 mg by mouth daily. (Patient not taking: Reported on 06/14/2024)     atorvastatin (LIPITOR) 40 MG tablet Take 40 mg by mouth at bedtime.     buPROPion  (WELLBUTRIN  XL) 300 MG 24 hr tablet Take 300 mg by mouth daily.  1   citalopram  (CELEXA ) 40 MG tablet Take 40 mg by mouth daily.     clopidogrel  (PLAVIX ) 75 MG tablet TAKE 1 TABLET BY MOUTH DAILY 90 tablet 3   losartan-hydrochlorothiazide (HYZAAR) 100-12.5 MG tablet Take 1 tablet by mouth daily.  (Patient not taking: Reported on 03/24/2023)  3   pramipexole  (MIRAPEX ) 0.25 MG tablet Take 0.25 mg by mouth every evening. Reported on 01/14/2016     tadalafil (CIALIS) 10 MG tablet Take 10 mg by mouth daily as needed for erectile dysfunction. (Patient not taking: Reported on 03/24/2023)     tiZANidine  (ZANAFLEX ) 4 MG capsule Take 1 capsule (4 mg total) by mouth 3 (three) times daily as needed for muscle spasms. 40 capsule 0   No current facility-administered medications for this visit.    REVIEW OF SYSTEMS (negative unless checked):   Cardiac:  []  Chest pain or chest pressure? []  Shortness of breath upon activity? []  Shortness of breath when lying flat? []  Irregular heart rhythm?  Vascular:  []  Pain in calf, thigh, or hip brought on by walking? []  Pain in  feet at night that wakes you up from your sleep? []  Blood clot in your veins? []  Leg swelling?  Pulmonary:  []  Oxygen at home? []  Productive cough? []  Wheezing?  Neurologic:  []  Sudden weakness in arms or legs? []  Sudden numbness in arms or legs? []  Sudden onset of difficult speaking or slurred speech? []  Temporary loss of vision in one eye? []  Problems with dizziness?  Gastrointestinal:  []  Blood in stool? []  Vomited blood?  Genitourinary:  []  Burning when urinating? []  Blood in urine?  Psychiatric:  []  Major depression  Hematologic:  []  Bleeding problems? []  Problems with blood clotting?  Dermatologic:  []  Rashes or ulcers?  Constitutional:  []  Fever or chills?  Ear/Nose/Throat:  []  Change in hearing? []  Nose bleeds? []  Sore throat?  Musculoskeletal:  []  Back pain? []  Joint pain? []  Muscle pain?   Physical Examination   Vitals:   06/14/24 1013  BP: (!) 170/63  Pulse: (!) 56  Temp: 98 F (36.7 C)  TempSrc: Temporal   There is no height or weight on file to calculate BMI.  General:  WDWN in NAD; vital signs documented above Gait: Not observed HENT: WNL, normocephalic Pulmonary: normal non-labored breathing , without rales, rhonchi,  wheezing Cardiac: Regular Abdomen: soft, NT, no masses Skin: without rashes Vascular Exam/Pulses: 1+ femoral pulses bilaterally.  Palpable PT  pulses bilaterally Extremities: without ischemic changes, without gangrene , without cellulitis; without open wounds;  Musculoskeletal: no muscle wasting or atrophy  Neurologic: A&O X 3;  No focal weakness or paresthesias are detected Psychiatric:  The pt has Normal affect.  Non-Invasive Vascular imaging   ABI (06/14/2024) R:  ABI: 1.15 (1.09),  PT: tri DP: tri TBI: 0.46 L:  ABI: 1.19 (1.15),  PT: tri DP: tri TBI: 0.77  Aortoiliac Duplex (06/14/2024) Patent bilateral common iliac artery stents.  50 to 99% stenosis of the right common iliac artery stent, with a PSV  of 487 cm/s.  50 to 99% stenosis of the left common iliac artery stent, with a PSV of 356 cm/s.   Medical Decision Making   Jason Mccall is a 63 y.o. male who presents for surveillance of PAD  Based on the patient's vascular studies, his ABIs are essentially unchanged bilaterally.  His right ABI is 1.15 and left ABI is 1.19 Arterial duplex demonstrates patent bilateral common iliac artery stents.  There appears to be worsening stenosis within both stents.  He has 50 to 99% stenosis of both stents, with a PSV of 487 cm/s on the right and a PSV of 356 cm/s on the left He has no complaints at today's office visit.  He denies any claudication, rest pain, or tissue loss.  He is taking a statin and Plavix  daily.  He is no longer taking aspirin. On exam he has 1+ femoral pulses bilaterally.  He has palpable PT pulses bilaterally.  He has no tissue loss on exam I have explained to the patient that he would benefit from repeat abdominal aortogram with investigation of both of his common iliac artery stents.  He appears to have moderate to high grade stenosis within both of the stents, worse on the right.  He may require balloon angioplasty of his stents or repeat stenting.  I have explained that this is important to do to prevent his stents from occluding.  He is agreeable for angiogram. We will schedule the patient for abdominal aortogram with possible intervention on bilateral common iliac artery stents, with Dr. Pearline, within the next couple of weeks.   Jason Holster PA-C [facility] [place] Office: [phone]  Clinic MD: [place]

## 2024-06-15 ENCOUNTER — Other Ambulatory Visit: Payer: Self-pay

## 2024-06-15 DIAGNOSIS — T82856A Stenosis of peripheral vascular stent, initial encounter: Secondary | ICD-10-CM

## 2024-07-02 ENCOUNTER — Other Ambulatory Visit: Payer: Self-pay

## 2024-07-02 ENCOUNTER — Encounter (HOSPITAL_COMMUNITY)
Admission: RE | Disposition: A | Discharge status: Home / Self Care | Payer: Self-pay | Source: Home / Self Care | Attending: Vascular Surgery

## 2024-07-02 ENCOUNTER — Ambulatory Visit (HOSPITAL_COMMUNITY)
Admission: RE | Admit: 2024-07-02 | Discharge: 2024-07-02 | Disposition: A | Discharge status: Home / Self Care | Attending: Vascular Surgery | Admitting: Vascular Surgery

## 2024-07-02 DIAGNOSIS — Z7902 Long term (current) use of antithrombotics/antiplatelets: Secondary | ICD-10-CM | POA: Diagnosis not present

## 2024-07-02 DIAGNOSIS — I708 Atherosclerosis of other arteries: Secondary | ICD-10-CM | POA: Diagnosis not present

## 2024-07-02 DIAGNOSIS — T82856A Stenosis of peripheral vascular stent, initial encounter: Secondary | ICD-10-CM | POA: Insufficient documentation

## 2024-07-02 DIAGNOSIS — Y831 Surgical operation with implant of artificial internal device as the cause of abnormal reaction of the patient, or of later complication, without mention of misadventure at the time of the procedure: Secondary | ICD-10-CM | POA: Insufficient documentation

## 2024-07-02 DIAGNOSIS — I739 Peripheral vascular disease, unspecified: Secondary | ICD-10-CM

## 2024-07-02 DIAGNOSIS — F17200 Nicotine dependence, unspecified, uncomplicated: Secondary | ICD-10-CM | POA: Diagnosis not present

## 2024-07-02 DIAGNOSIS — Z79899 Other long term (current) drug therapy: Secondary | ICD-10-CM | POA: Diagnosis not present

## 2024-07-02 HISTORY — PX: LOWER EXTREMITY ANGIOGRAPHY: CATH118251

## 2024-07-02 HISTORY — PX: ABDOMINAL AORTOGRAM W/LOWER EXTREMITY: CATH118223

## 2024-07-02 HISTORY — PX: LOWER EXTREMITY INTERVENTION: CATH118252

## 2024-07-02 LAB — POCT I-STAT, CHEM 8
BUN: 15 mg/dL (ref 8–23)
Calcium, Ion: 1.27 mmol/L (ref 1.15–1.40)
Chloride: 101 mmol/L (ref 98–111)
Creatinine, Ser: 0.8 mg/dL (ref 0.61–1.24)
Glucose, Bld: 113 mg/dL — ABNORMAL HIGH (ref 70–99)
HCT: 42 % (ref 39.0–52.0)
Hemoglobin: 14.3 g/dL (ref 13.0–17.0)
Potassium: 4.2 mmol/L (ref 3.5–5.1)
Sodium: 141 mmol/L (ref 135–145)
TCO2: 26 mmol/L (ref 22–32)

## 2024-07-02 SURGERY — ABDOMINAL AORTOGRAM W/LOWER EXTREMITY
Anesthesia: LOCAL

## 2024-07-02 MED ORDER — SODIUM CHLORIDE 0.9 % WEIGHT BASED INFUSION: 1.0000 mL/kg/h | INTRAVENOUS | Status: DC

## 2024-07-02 MED ORDER — ASPIRIN 81 MG PO CHEW
CHEWABLE_TABLET | ORAL | Status: AC
  Filled 2024-07-02: qty 1

## 2024-07-02 MED ORDER — FENTANYL CITRATE (PF) 100 MCG/2ML IJ SOLN
INTRAMUSCULAR | Status: AC
  Filled 2024-07-02: qty 2

## 2024-07-02 MED ORDER — SODIUM CHLORIDE 0.9 % IV SOLN
INTRAVENOUS | Status: DC | PRN
  Administered 2024-07-02: 10 mL/h via INTRAVENOUS

## 2024-07-02 MED ORDER — SODIUM CHLORIDE 0.9 % IV SOLN: INTRAVENOUS | Status: DC

## 2024-07-02 MED ORDER — HEPARIN (PORCINE) IN NACL 1000-0.9 UT/500ML-% IV SOLN
INTRAVENOUS | Status: DC | PRN
  Administered 2024-07-02: 1000 mL

## 2024-07-02 MED ORDER — OXYCODONE HCL 5 MG PO TABS: 5.0000 mg | ORAL_TABLET | ORAL | Status: DC | PRN

## 2024-07-02 MED ORDER — IODIXANOL 320 MG/ML IV SOLN
INTRAVENOUS | Status: DC | PRN
  Administered 2024-07-02: 30 mL

## 2024-07-02 MED ORDER — LIDOCAINE HCL (PF) 1 % IJ SOLN
INTRAMUSCULAR | Status: DC | PRN
  Administered 2024-07-02: 2 mL

## 2024-07-02 MED ORDER — SODIUM CHLORIDE 0.9% FLUSH
3.0000 mL | Freq: Two times a day (BID) | INTRAVENOUS | Status: DC
Start: 2024-07-02 — End: 2024-07-02

## 2024-07-02 MED ORDER — SODIUM CHLORIDE 0.9% FLUSH: 3.0000 mL | INTRAVENOUS | Status: DC | PRN

## 2024-07-02 MED ORDER — LABETALOL HCL 5 MG/ML IV SOLN: 10.0000 mg | INTRAVENOUS | Status: DC | PRN

## 2024-07-02 MED ORDER — ONDANSETRON HCL 4 MG/2ML IJ SOLN: 4.0000 mg | Freq: Four times a day (QID) | INTRAMUSCULAR | Status: DC | PRN

## 2024-07-02 MED ORDER — MIDAZOLAM HCL (PF) 2 MG/2ML IJ SOLN
INTRAMUSCULAR | Status: DC | PRN
  Administered 2024-07-02: 1 mg via INTRAVENOUS

## 2024-07-02 MED ORDER — ACETAMINOPHEN 325 MG PO TABS: 650.0000 mg | ORAL_TABLET | ORAL | Status: DC | PRN

## 2024-07-02 MED ORDER — FENTANYL CITRATE (PF) 100 MCG/2ML IJ SOLN
INTRAMUSCULAR | Status: DC | PRN
  Administered 2024-07-02: 50 ug via INTRAVENOUS

## 2024-07-02 MED ORDER — SODIUM CHLORIDE 0.9 % IV SOLN: 250.0000 mL | INTRAVENOUS | Status: DC | PRN

## 2024-07-02 MED ORDER — HYDRALAZINE HCL 20 MG/ML IJ SOLN: 5.0000 mg | INTRAMUSCULAR | Status: DC | PRN

## 2024-07-02 MED ORDER — MIDAZOLAM HCL 2 MG/2ML IJ SOLN
INTRAMUSCULAR | Status: AC
  Filled 2024-07-02: qty 2

## 2024-07-02 SURGICAL SUPPLY — 14 items
BALLOON LUTONIX AV 8X40X75 (BALLOONS) IMPLANT
CATH OMNI FLUSH 5F 65CM (CATHETERS) IMPLANT
CLOSURE MYNX CONTROL 6F/7F (Vascular Products) IMPLANT
KIT ENCORE 26 ADVANTAGE (KITS) IMPLANT
KIT MICROPUNCTURE NIT STIFF (SHEATH) IMPLANT
KIT SINGLE USE MANIFOLD (KITS) IMPLANT
KIT SYRINGE INJ CVI SPIKEX1 (MISCELLANEOUS) IMPLANT
PACK CARDIAC CATHETERIZATION (CUSTOM PROCEDURE TRAY) IMPLANT
SET ATX-X65L (MISCELLANEOUS) IMPLANT
SHEATH BRITE TIP 6FR 35CM (SHEATH) IMPLANT
SHEATH PINNACLE 5F 10CM (SHEATH) IMPLANT
SHEATH PINNACLE 6F 10CM (SHEATH) IMPLANT
SHEATH PROBE COVER 6X72 (BAG) IMPLANT
WIRE BENTSON .035X145CM (WIRE) IMPLANT

## 2024-07-02 NOTE — Progress Notes (Signed)
 RFA site level 0 before pt ambulated. Pt. Ambulated without difficulty. RFA site is clean, dry, intact. Pt. Offers no complaints. Pt. Family to assist with getting dressed.

## 2024-07-02 NOTE — Op Note (Signed)
    Patient name: Jason Mccall MRN: [id number] DOB: 10-29-1960 Sex: male  07/02/2024 Pre-operative Diagnosis: PAD with in-stent restenosis of iliac stent Post-operative diagnosis:  Same Surgeon:  Norman GORMAN Serve, MD Procedure Performed:  Ultrasound-guided access of right femoral artery Placement of catheter into aorta and aortogram Drug-coated balloon angioplasty of right common iliac stent, 8 x 40 Mynx closure of right common femoral artery 20 minutes of moderate sedation with fentanyl  and Versed    Indications: Mr. Manolis is a 63 year old male who presented to the office a few weeks ago for surveillance of PAD.  He has history of bilateral common iliac stenting.  He denied any symptoms and has been compliant with Plavix  although has not been taking aspirin.  The duplex demonstrated a worsening stenosis in the right common iliac stent with velocities over 400.  Risk and benefits of angiogram were reviewed and he elected to proceed  Findings:  Approximate 60%  in-stent restenosis of the right common iliac stent.  The left external iliac stent is without flow-limiting stenosis.   Procedure:  The patient was identified in the holding area and taken to the cath lab  The patient was then placed supine on the table and prepped and draped in the usual sterile fashion.  A time out was called.  Ultrasound was used to evaluate the right common femoral artery.  It was patent .  A digital ultrasound image was acquired.  A micropuncture needle was used to access the right common femoral artery under ultrasound guidance.  An 018 wire was advanced without resistance and a micropuncture sheath was placed.  The 018 wire was removed and a benson wire was placed.  The micropuncture sheath was exchanged for a 5 french sheath.  An omniflush catheter was advanced over the wire to the level of L-1.  An abdominal angiogram was obtained.  Next the catheter was pulled down close to the bifurcation and an aortogram was  obtained and a LAO projection to better visualize the right common iliac stent.  This demonstrated the above findings.  The short 5 French sheath was then exchanged for a 6 French Brite tip sheath and the patient was systemically heparinized.  This in-stent restenosis was treated with an 8 mm x 40 mm drug-coated balloon.  Completion angiogram demonstrated an excellent result with less than 20% residual stenosis.  The 6 French Brite tip sheath was exchanged for a short 6 French sheath and a minx device was deployed with excellent hemostasis.  Contrast: 35 cc Sedation: 20 minutes  Impression: Successful drug-coated balloon angioplasty of right common iliac in-stent restenosis.  No flow-limiting stenosis of the left iliac stent. Maximally revascularized.   Norman GORMAN Serve MD [facility] Office: [phone]

## 2024-07-03 ENCOUNTER — Encounter (HOSPITAL_COMMUNITY): Payer: Self-pay | Admitting: Vascular Surgery

## 2024-07-11 NOTE — Interval H&P Note (Signed)
 History and Physical Interval Note:  07/11/2024 12:44 PM  Jason Mccall  has presented today for surgery, with the diagnosis of Stent stenosis.  The various methods of treatment have been discussed with the patient and family. After consideration of risks, benefits and other options for treatment, the patient has consented to  Procedure(s): ABDOMINAL AORTOGRAM W/LOWER EXTREMITY (N/A) Lower Extremity Angiography (Bilateral) LOWER EXTREMITY INTERVENTION (Bilateral) as a surgical intervention.  The patient's history has been reviewed, patient examined, no change in status, stable for surgery.  I have reviewed the patient's chart and labs.  Questions were answered to the patient's satisfaction.     Norman GORMAN Serve

## 2024-12-07 ENCOUNTER — Ambulatory Visit (HOSPITAL_COMMUNITY)

## 2024-12-07 ENCOUNTER — Ambulatory Visit
